# Patient Record
Sex: Female | Born: 2000
Health system: Southern US, Community
[De-identification: ages and names within clinical notes are randomized; demographics above are authoritative.]

## PROBLEM LIST (undated history)

## (undated) DIAGNOSIS — I10 Essential (primary) hypertension: Secondary | ICD-10-CM

## (undated) DIAGNOSIS — N83209 Unspecified ovarian cyst, unspecified side: Secondary | ICD-10-CM

## (undated) DIAGNOSIS — K219 Gastro-esophageal reflux disease without esophagitis: Secondary | ICD-10-CM

## (undated) DIAGNOSIS — R519 Headache, unspecified: Secondary | ICD-10-CM

## (undated) HISTORY — PX: NO PAST SURGERIES: SHX2092

---
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
## 2018-01-01 ENCOUNTER — Other Ambulatory Visit: Payer: Self-pay

## 2018-01-01 ENCOUNTER — Encounter
Admission: RE | Admit: 2018-01-01 | Discharge: 2018-01-01 | Disposition: A | Discharge status: Home / Self Care | Payer: 59 | Source: Ambulatory Visit | Attending: Obstetrics & Gynecology | Admitting: Obstetrics & Gynecology

## 2018-01-01 HISTORY — DX: Gastro-esophageal reflux disease without esophagitis: K21.9

## 2018-01-01 NOTE — Patient Instructions (Signed)
Your procedure is scheduled on: 01-02-18 Report to Same Day Surgery 2nd floor medical mall [facility] Entrance-take elevator on left to 2nd floor.  Check in with surgery information desk.) To find out your arrival time please call [phone] between 1PM - 3PM on 01-01-18  Remember: Instructions that are not followed completely may result in serious medical risk, up to and including death, or upon the discretion of your surgeon and anesthesiologist your surgery may need to be rescheduled.    _x___ 1. Do not eat food after midnight the night before your procedure. You may drink clear liquids up to 2 hours before you are scheduled to arrive at the hospital for your procedure.  Do not drink clear liquids within 2 hours of your scheduled arrival to the hospital.  Clear liquids include  --Water or Apple juice without pulp  --Clear carbohydrate beverage such as ClearFast or Gatorade  --Black Coffee or Clear Tea (No milk, no creamers, do not add anything to the coffee or Tea    ____Ensure clear carbohydrate drink on the way to the hospital for bariatric patients  ____Ensure clear carbohydrate drink 3 hours before surgery for Dr Rutherford Nail patients if physician instructed.   No gum chewing or hard candies.     __x__ 2. No Alcohol for 24 hours before or after surgery.   __x__3. No Smoking or e-cigarettes for 24 prior to surgery.  Do not use any chewable tobacco products for at least 6 hour prior to surgery   ____  4. Bring all medications with you on the day of surgery if instructed.    __x__ 5. Notify your doctor if there is any change in your medical condition     (cold, fever, infections).    x___6. On the morning of surgery brush your teeth with toothpaste and water.  You may rinse your mouth with mouth wash if you wish.  Do not swallow any toothpaste or mouthwash.   Do not wear jewelry, make-up, hairpins, clips or nail polish.  Do not wear lotions, powders, or perfumes. You may  wear deodorant.  Do not shave 48 hours prior to surgery. Men may shave face and neck.  Do not bring valuables to the hospital.    [facility] is not responsible for any belongings or valuables.               Contacts, dentures or bridgework may not be worn into surgery.  Leave your suitcase in the car. After surgery it may be brought to your room.  For patients admitted to the hospital, discharge time is determined by your treatment team.  _  Patients discharged the day of surgery will not be allowed to drive home.  You will need someone to drive you home and stay with you the night of your procedure.    Please read over the following fact sheets that you were given:   [facility] Preparing for Surgery   ____ Take anti-hypertensive listed below, cardiac, seizure, asthma, anti-reflux and psychiatric medicines. These include:  1. NONE  2.  3.  4.  5.  6.  ____Fleets enema or Magnesium Citrate as directed.   ____ Use CHG Soap or sage wipes as directed on instruction sheet   ____ Use inhalers on the day of surgery and bring to hospital day of surgery  ____ Stop Metformin and Janumet 2 days prior to surgery.    ____ Take 1/2 of usual insulin dose the night before surgery and none  on the morning surgery.   ____ Follow recommendations from Cardiologist, Pulmonologist or PCP regarding stopping Aspirin, Coumadin, Plavix ,Eliquis, Effient, or Pradaxa, and Pletal.  X____Stop Anti-inflammatories such as Advil, Aleve, Ibuprofen, Motrin, Naproxen, MELOXICAM, Naprosyn, Goodies powders or aspirin products NOW-OK to take Tylenol    ____ Stop supplements until after surgery.     ____ Bring C-Pap to the hospital.

## 2018-01-02 ENCOUNTER — Ambulatory Visit
Admission: RE | Admit: 2018-01-02 | Discharge: 2018-01-02 | Disposition: A | Discharge status: Home / Self Care | Payer: 59 | Source: Ambulatory Visit | Attending: Obstetrics & Gynecology | Admitting: Obstetrics & Gynecology

## 2018-01-02 ENCOUNTER — Ambulatory Visit: Payer: 59 | Admitting: Certified Registered"

## 2018-01-02 ENCOUNTER — Encounter
Admission: RE | Disposition: A | Discharge status: Home / Self Care | Payer: Self-pay | Source: Ambulatory Visit | Attending: Obstetrics & Gynecology

## 2018-01-02 ENCOUNTER — Other Ambulatory Visit: Payer: Self-pay

## 2018-01-02 DIAGNOSIS — Z30430 Encounter for insertion of intrauterine contraceptive device: Secondary | ICD-10-CM | POA: Insufficient documentation

## 2018-01-02 DIAGNOSIS — N83209 Unspecified ovarian cyst, unspecified side: Secondary | ICD-10-CM | POA: Diagnosis not present

## 2018-01-02 DIAGNOSIS — N920 Excessive and frequent menstruation with regular cycle: Secondary | ICD-10-CM | POA: Insufficient documentation

## 2018-01-02 DIAGNOSIS — R102 Pelvic and perineal pain: Secondary | ICD-10-CM | POA: Diagnosis present

## 2018-01-02 DIAGNOSIS — Z79899 Other long term (current) drug therapy: Secondary | ICD-10-CM | POA: Diagnosis not present

## 2018-01-02 DIAGNOSIS — N9489 Other specified conditions associated with female genital organs and menstrual cycle: Secondary | ICD-10-CM | POA: Diagnosis present

## 2018-01-02 DIAGNOSIS — N809 Endometriosis, unspecified: Secondary | ICD-10-CM | POA: Insufficient documentation

## 2018-01-02 DIAGNOSIS — K219 Gastro-esophageal reflux disease without esophagitis: Secondary | ICD-10-CM | POA: Diagnosis not present

## 2018-01-02 DIAGNOSIS — N840 Polyp of corpus uteri: Secondary | ICD-10-CM | POA: Insufficient documentation

## 2018-01-02 DIAGNOSIS — N858 Other specified noninflammatory disorders of uterus: Secondary | ICD-10-CM | POA: Diagnosis not present

## 2018-01-02 DIAGNOSIS — Z30431 Encounter for routine checking of intrauterine contraceptive device: Secondary | ICD-10-CM | POA: Diagnosis not present

## 2018-01-02 HISTORY — PX: INTRAUTERINE DEVICE (IUD) INSERTION: SHX5877

## 2018-01-02 HISTORY — PX: HYSTEROSCOPY WITH D & C: SHX1775

## 2018-01-02 HISTORY — PX: LAPAROSCOPIC UNILATERAL SALPINGECTOMY: SHX5934

## 2018-01-02 HISTORY — PX: CHROMOPERTUBATION: SHX6288

## 2018-01-02 HISTORY — PX: LAPAROSCOPY: SHX197

## 2018-01-02 LAB — CBC
HCT: 44.6 % (ref 36.0–46.0)
Hemoglobin: 14.1 g/dL (ref 12.0–15.0)
MCH: 27.3 pg (ref 26.0–34.0)
MCHC: 31.6 g/dL (ref 30.0–36.0)
MCV: 86.3 fL (ref 80.0–100.0)
Platelets: 396 10*3/uL (ref 150–400)
RBC: 5.17 MIL/uL — AB (ref 3.87–5.11)
RDW: 13.3 % (ref 11.5–15.5)
WBC: 8.6 10*3/uL (ref 4.0–10.5)
nRBC: 0 % (ref 0.0–0.2)

## 2018-01-02 LAB — TYPE AND SCREEN
ABO/RH(D): A NEG
ANTIBODY SCREEN: NEGATIVE

## 2018-01-02 LAB — BASIC METABOLIC PANEL
Anion gap: 7 (ref 5–15)
BUN: 11 mg/dL (ref 6–20)
CALCIUM: 9.2 mg/dL (ref 8.9–10.3)
CO2: 25 mmol/L (ref 22–32)
CREATININE: 0.64 mg/dL (ref 0.44–1.00)
Chloride: 107 mmol/L (ref 98–111)
GFR calc Af Amer: >60 mL/min (ref >60)
GFR calc non Af Amer: >60 mL/min (ref >60)
GLUCOSE: 95 mg/dL (ref 70–99)
Potassium: 3.8 mmol/L (ref 3.5–5.1)
Sodium: 139 mmol/L (ref 135–145)

## 2018-01-02 LAB — POCT PREGNANCY, URINE: Preg Test, Ur: NEGATIVE

## 2018-01-02 LAB — ABO/RH: ABO/RH(D): A NEG

## 2018-01-02 SURGERY — LAPAROSCOPY, DIAGNOSTIC
Anesthesia: General | Laterality: Right

## 2018-01-02 MED ORDER — FENTANYL CITRATE (PF) 100 MCG/2ML IJ SOLN
INTRAMUSCULAR | Status: DC | PRN
  Administered 2018-01-02: 100 ug via INTRAVENOUS
  Administered 2018-01-02 (×3): 50 ug via INTRAVENOUS

## 2018-01-02 MED ORDER — DEXAMETHASONE SODIUM PHOSPHATE 10 MG/ML IJ SOLN
4.0000 mg | INTRAMUSCULAR | Status: AC
  Administered 2018-01-02: 4 mg via INTRAVENOUS

## 2018-01-02 MED ORDER — FAMOTIDINE 20 MG PO TABS
20.0000 mg | ORAL_TABLET | Freq: Once | ORAL | Status: AC
  Administered 2018-01-02: 20 mg via ORAL

## 2018-01-02 MED ORDER — IBUPROFEN 800 MG PO TABS: 800.0000 mg | ORAL_TABLET | Freq: Four times a day (QID) | ORAL | 0 refills | Status: DC

## 2018-01-02 MED ORDER — ONDANSETRON HCL 4 MG/2ML IJ SOLN
INTRAMUSCULAR | Status: AC
  Filled 2018-01-02: qty 2

## 2018-01-02 MED ORDER — LIDOCAINE HCL (PF) 2 % IJ SOLN
INTRAMUSCULAR | Status: AC
  Filled 2018-01-02: qty 10

## 2018-01-02 MED ORDER — ACETAMINOPHEN 650 MG RE SUPP
650.0000 mg | RECTAL | Status: DC | PRN
  Filled 2018-01-02: qty 1

## 2018-01-02 MED ORDER — GABAPENTIN 300 MG PO CAPS
ORAL_CAPSULE | ORAL | Status: AC
  Administered 2018-01-02: 600 mg via ORAL
  Filled 2018-01-02: qty 2

## 2018-01-02 MED ORDER — FENTANYL CITRATE (PF) 100 MCG/2ML IJ SOLN
INTRAMUSCULAR | Status: AC
  Administered 2018-01-02: 25 ug via INTRAVENOUS
  Filled 2018-01-02: qty 2

## 2018-01-02 MED ORDER — PROMETHAZINE HCL 25 MG/ML IJ SOLN: 6.2500 mg | INTRAMUSCULAR | Status: DC | PRN

## 2018-01-02 MED ORDER — MIDAZOLAM HCL 2 MG/2ML IJ SOLN
INTRAMUSCULAR | Status: DC | PRN
  Administered 2018-01-02: 2 mg via INTRAVENOUS

## 2018-01-02 MED ORDER — METHYLENE BLUE 0.5 % INJ SOLN
INTRAVENOUS | Status: AC
  Filled 2018-01-02: qty 10

## 2018-01-02 MED ORDER — CELECOXIB 200 MG PO CAPS
ORAL_CAPSULE | ORAL | Status: AC
  Administered 2018-01-02: 400 mg via ORAL
  Filled 2018-01-02: qty 2

## 2018-01-02 MED ORDER — MIDAZOLAM HCL 2 MG/2ML IJ SOLN
INTRAMUSCULAR | Status: AC
  Filled 2018-01-02: qty 2

## 2018-01-02 MED ORDER — DEXAMETHASONE SODIUM PHOSPHATE 10 MG/ML IJ SOLN
INTRAMUSCULAR | Status: AC
  Administered 2018-01-02: 4 mg via INTRAVENOUS
  Filled 2018-01-02: qty 1

## 2018-01-02 MED ORDER — SUGAMMADEX SODIUM 200 MG/2ML IV SOLN
INTRAVENOUS | Status: DC | PRN
  Administered 2018-01-02: 200 mg via INTRAVENOUS

## 2018-01-02 MED ORDER — FENTANYL CITRATE (PF) 100 MCG/2ML IJ SOLN
INTRAMUSCULAR | Status: AC
  Filled 2018-01-02: qty 2

## 2018-01-02 MED ORDER — PROPOFOL 10 MG/ML IV BOLUS
INTRAVENOUS | Status: DC | PRN
  Administered 2018-01-02: 200 mg via INTRAVENOUS

## 2018-01-02 MED ORDER — DEXAMETHASONE SODIUM PHOSPHATE 10 MG/ML IJ SOLN
INTRAMUSCULAR | Status: AC
  Filled 2018-01-02: qty 1

## 2018-01-02 MED ORDER — LACTATED RINGERS IV SOLN
INTRAVENOUS | Status: DC
  Administered 2018-01-02: 10:00:00 via INTRAVENOUS

## 2018-01-02 MED ORDER — FENTANYL CITRATE (PF) 100 MCG/2ML IJ SOLN
25.0000 ug | INTRAMUSCULAR | Status: AC | PRN
  Administered 2018-01-02 (×6): 25 ug via INTRAVENOUS

## 2018-01-02 MED ORDER — CELECOXIB 200 MG PO CAPS
400.0000 mg | ORAL_CAPSULE | ORAL | Status: AC
  Administered 2018-01-02: 400 mg via ORAL

## 2018-01-02 MED ORDER — KETOROLAC TROMETHAMINE 30 MG/ML IJ SOLN
INTRAMUSCULAR | Status: DC | PRN
  Administered 2018-01-02: 30 mg via INTRAVENOUS

## 2018-01-02 MED ORDER — ONDANSETRON HCL 4 MG/2ML IJ SOLN
INTRAMUSCULAR | Status: DC | PRN
  Administered 2018-01-02: 4 mg via INTRAVENOUS

## 2018-01-02 MED ORDER — SUGAMMADEX SODIUM 200 MG/2ML IV SOLN
INTRAVENOUS | Status: AC
  Filled 2018-01-02: qty 2

## 2018-01-02 MED ORDER — MORPHINE SULFATE (PF) 4 MG/ML IV SOLN: 1.0000 mg | INTRAVENOUS | Status: DC | PRN

## 2018-01-02 MED ORDER — ACETAMINOPHEN 500 MG PO TABS
ORAL_TABLET | ORAL | Status: AC
  Administered 2018-01-02: 1000 mg via ORAL
  Filled 2018-01-02: qty 2

## 2018-01-02 MED ORDER — GABAPENTIN 300 MG PO CAPS
600.0000 mg | ORAL_CAPSULE | ORAL | Status: AC
  Administered 2018-01-02: 600 mg via ORAL

## 2018-01-02 MED ORDER — ROCURONIUM BROMIDE 50 MG/5ML IV SOLN
INTRAVENOUS | Status: AC
  Filled 2018-01-02: qty 1

## 2018-01-02 MED ORDER — OXYCODONE HCL 5 MG PO TABS: 5.0000 mg | ORAL_TABLET | Freq: Three times a day (TID) | ORAL | 0 refills | Status: AC | PRN

## 2018-01-02 MED ORDER — PROPOFOL 10 MG/ML IV BOLUS
INTRAVENOUS | Status: AC
  Filled 2018-01-02: qty 20

## 2018-01-02 MED ORDER — ACETAMINOPHEN 325 MG PO TABS: 650.0000 mg | ORAL_TABLET | ORAL | Status: DC | PRN

## 2018-01-02 MED ORDER — KETOROLAC TROMETHAMINE 30 MG/ML IJ SOLN: 30.0000 mg | Freq: Four times a day (QID) | INTRAMUSCULAR | Status: DC

## 2018-01-02 MED ORDER — DEXAMETHASONE SODIUM PHOSPHATE 10 MG/ML IJ SOLN
INTRAMUSCULAR | Status: DC | PRN
  Administered 2018-01-02: 8 mg via INTRAVENOUS

## 2018-01-02 MED ORDER — ROCURONIUM BROMIDE 100 MG/10ML IV SOLN
INTRAVENOUS | Status: DC | PRN
  Administered 2018-01-02 (×2): 10 mg via INTRAVENOUS
  Administered 2018-01-02: 40 mg via INTRAVENOUS

## 2018-01-02 MED ORDER — LIDOCAINE HCL (CARDIAC) PF 100 MG/5ML IV SOSY
PREFILLED_SYRINGE | INTRAVENOUS | Status: DC | PRN
  Administered 2018-01-02: 100 mg via INTRAVENOUS

## 2018-01-02 MED ORDER — BUPIVACAINE HCL (PF) 0.5 % IJ SOLN
INTRAMUSCULAR | Status: AC
  Filled 2018-01-02: qty 30

## 2018-01-02 MED ORDER — FAMOTIDINE 20 MG PO TABS
ORAL_TABLET | ORAL | Status: AC
  Administered 2018-01-02: 20 mg via ORAL
  Filled 2018-01-02: qty 1

## 2018-01-02 MED ORDER — ACETAMINOPHEN 500 MG PO TABS
1000.0000 mg | ORAL_TABLET | ORAL | Status: AC
  Administered 2018-01-02: 1000 mg via ORAL

## 2018-01-02 MED ORDER — SCOPOLAMINE 1 MG/3DAYS TD PT72
MEDICATED_PATCH | TRANSDERMAL | Status: AC
  Filled 2018-01-02: qty 1

## 2018-01-02 MED ORDER — SCOPOLAMINE 1 MG/3DAYS TD PT72
1.0000 | MEDICATED_PATCH | TRANSDERMAL | Status: DC
  Administered 2018-01-02: 1.5 mg via TRANSDERMAL

## 2018-01-02 MED ORDER — LACTATED RINGERS IV SOLN: INTRAVENOUS | Status: DC

## 2018-01-02 SURGICAL SUPPLY — 58 items
BAG INFUSER PRESSURE 100CC (MISCELLANEOUS) ×3 IMPLANT
BAG URINE DRAINAGE (UROLOGICAL SUPPLIES) ×3 IMPLANT
BLADE SURG SZ11 CARB STEEL (BLADE) ×3 IMPLANT
CANISTER SUCT 1200ML W/VALVE (MISCELLANEOUS) ×3 IMPLANT
CATH FOLEY 2WAY  5CC 16FR (CATHETERS) ×1
CATH ROBINSON RED A/P 16FR (CATHETERS) ×3 IMPLANT
CATH URTH 16FR FL 2W BLN LF (CATHETERS) ×2 IMPLANT
CHLORAPREP W/TINT 26ML (MISCELLANEOUS) ×3 IMPLANT
COVER WAND RF STERILE (DRAPES) ×3 IMPLANT
DEFOGGER SCOPE WARMER CLEARIFY (MISCELLANEOUS) ×3 IMPLANT
DERMABOND ADVANCED (GAUZE/BANDAGES/DRESSINGS) ×1
DERMABOND ADVANCED .7 DNX12 (GAUZE/BANDAGES/DRESSINGS) ×2 IMPLANT
DRAPE LEGGINS SURG 28X43 STRL (DRAPES) ×3 IMPLANT
DRAPE UNDER BUTTOCK W/FLU (DRAPES) ×3 IMPLANT
ELECT REM PT RETURN 9FT ADLT (ELECTROSURGICAL) ×3
ELECTRODE REM PT RTRN 9FT ADLT (ELECTROSURGICAL) ×2 IMPLANT
GLOVE PI ORTHOPRO 6.5 (GLOVE) ×1
GLOVE PI ORTHOPRO STRL 6.5 (GLOVE) ×2 IMPLANT
GLOVE SURG SYN 6.5 ES PF (GLOVE) ×6 IMPLANT
GOWN STRL REUS W/ TWL LRG LVL3 (GOWN DISPOSABLE) ×4 IMPLANT
GOWN STRL REUS W/TWL LRG LVL3 (GOWN DISPOSABLE) ×2
GRASPER SUT TROCAR 14GX15 (MISCELLANEOUS) ×3 IMPLANT
HANDLE YANKAUER SUCT BULB TIP (MISCELLANEOUS) ×3 IMPLANT
IRRIGATION STRYKERFLOW (MISCELLANEOUS) IMPLANT
IRRIGATOR STRYKERFLOW (MISCELLANEOUS)
IV NS 1000ML (IV SOLUTION)
IV NS 1000ML BAXH (IV SOLUTION) IMPLANT
KIT PINK PAD W/HEAD ARE REST (MISCELLANEOUS) ×3
KIT PINK PAD W/HEAD ARM REST (MISCELLANEOUS) ×2 IMPLANT
KIT PROCEDURE FLUENT (KITS) IMPLANT
KIT TURNOVER CYSTO (KITS) ×3 IMPLANT
L-HOOK LAP DISP 36CM (ELECTROSURGICAL) ×3
LABEL OR SOLS (LABEL) IMPLANT
LHOOK LAP DISP 36CM (ELECTROSURGICAL) ×2 IMPLANT
LIGASURE VESSEL 5MM BLUNT TIP (ELECTROSURGICAL) ×3 IMPLANT
Liletta ×3 IMPLANT
MANIPULATOR UTERINE 4.5 ZUMI (MISCELLANEOUS) ×3 IMPLANT
NEEDLE HYPO 22GX1.5 SAFETY (NEEDLE) ×3 IMPLANT
NEEDLE SPNL 22GX3.5 QUINCKE BK (NEEDLE) ×3 IMPLANT
NS IRRIG 500ML POUR BTL (IV SOLUTION) ×3 IMPLANT
PACK DNC HYST (MISCELLANEOUS) ×3 IMPLANT
PACK LAP CHOLECYSTECTOMY (MISCELLANEOUS) ×3 IMPLANT
PAD OB MATERNITY 4.3X12.25 (PERSONAL CARE ITEMS) ×3 IMPLANT
PAD PREP 24X41 OB/GYN DISP (PERSONAL CARE ITEMS) ×3 IMPLANT
PENCIL ELECTRO HAND CTR (MISCELLANEOUS) ×3 IMPLANT
POUCH SPECIMEN RETRIEVAL 10MM (ENDOMECHANICALS) IMPLANT
SCISSORS METZENBAUM CVD 33 (INSTRUMENTS) ×3 IMPLANT
SLEEVE ENDOPATH XCEL 5M (ENDOMECHANICALS) ×6 IMPLANT
SOL .9 NS 3000ML IRR  AL (IV SOLUTION)
SOL .9 NS 3000ML IRR UROMATIC (IV SOLUTION) IMPLANT
SUT MNCRL AB 4-0 PS2 18 (SUTURE) ×3 IMPLANT
SUT VIC AB 2-0 UR6 27 (SUTURE) ×3 IMPLANT
SYR 10ML LL (SYRINGE) ×3 IMPLANT
TOWEL OR 17X26 4PK STRL BLUE (TOWEL DISPOSABLE) ×3 IMPLANT
TROCAR ENDO BLADELESS 11MM (ENDOMECHANICALS) IMPLANT
TROCAR XCEL NON-BLD 5MMX100MML (ENDOMECHANICALS) ×3 IMPLANT
TUBING CONNECTING 10 (TUBING) ×3 IMPLANT
TUBING INSUFFLATION (TUBING) ×3 IMPLANT

## 2018-01-02 NOTE — Discharge Instructions (Signed)
AMBULATORY SURGERY  °DISCHARGE INSTRUCTIONS ° ° °1) The drugs that you were given will stay in your system until tomorrow so for the next 24 hours you should not: ° °A) Drive an automobile °B) Make any legal decisions °C) Drink any alcoholic beverage ° ° °2) You may resume regular meals tomorrow.  Today it is better to start with liquids and gradually work up to solid foods. ° °You may eat anything you prefer, but it is better to start with liquids, then soup and crackers, and gradually work up to solid foods. ° ° °3) Please notify your doctor immediately if you have any unusual bleeding, trouble breathing, redness and pain at the surgery site, drainage, fever, or pain not relieved by medication. ° ° ° °4) Additional Instructions: ° ° ° ° ° ° ° °Please contact your physician with any problems or Same Day Surgery at [phone], Monday through Friday 6 am to 4 pm, or [facility] [place] number at [phone].Diagnostic Laparoscopy, Care After °Refer to this sheet in the next few weeks. These instructions provide you with information about caring for yourself after your procedure. Your health care provider may also give you more specific instructions. Your treatment has been planned according to current medical practices, but problems sometimes occur. Call your health care provider if you have any problems or questions after your procedure. °What can I expect after the procedure? °After your procedure, it is common to have mild discomfort in the throat and abdomen. °Follow these instructions at home: °· Take over-the-counter and prescription medicines only as told by your health care provider. °· Do not drive for 24 hours if you received a sedative. °· Return to your normal activities as told by your health care provider. °· Do not take baths, swim, or use a hot tub until your health care provider approves. You may shower. °· Follow instructions from your health care provider about how to take care of your  incision. Make sure you: °? Wash your hands with soap and water before you change your bandage (dressing). If soap and water are not available, use hand sanitizer. °? Change your dressing as told by your health care provider. °? Leave stitches (sutures), skin glue, or adhesive strips in place. These skin closures may need to stay in place for 2 weeks or longer. If adhesive strip edges start to loosen and curl up, you may trim the loose edges. Do not remove adhesive strips completely unless your health care provider tells you to do that. °· Check your incision area every day for signs of infection. Check for: °? More redness, swelling, or pain. °? More fluid or blood. °? Warmth. °? Pus or a bad smell. °· It is your responsibility to get the results of your procedure. Ask your health care provider or the department performing the procedure when your results will be ready. °Contact a health care provider if: °· There is new pain in your shoulders. °· You feel light-headed or faint. °· You are unable to pass gas or unable to have a bowel movement. °· You feel nauseous or you vomit. °· You develop a rash. °· You have more redness, swelling, or pain around your incision. °· You have more fluid or blood coming from your incision. °· Your incision feels warm to the touch. °· You have pus or a bad smell coming from your incision. °· You have a fever or chills. °Get help right away if: °· Your pain is getting worse. °·   You have ongoing vomiting. °· The edges of your incision open up. °· You have trouble breathing. °· You have chest pain. °This information is not intended to replace advice given to you by your health care provider. Make sure you discuss any questions you have with your health care provider. °Document Released: 02/01/2015 Document Revised: 07/29/2015 Document Reviewed: 11/03/2014 °[employer]vier Interactive Patient Education © 2018 [employer] ° °

## 2018-01-02 NOTE — Anesthesia Preprocedure Evaluation (Addendum)
Anesthesia Evaluation  Patient identified by MRN, date of birth, ID band Patient awake    Reviewed: Allergy & Precautions, H&P , NPO status , Patient's Chart, lab work & pertinent test results  Airway Mallampati: II  TM Distance: >3 FB Neck ROM: full    Dental  (+) Teeth Intact   Pulmonary neg pulmonary ROS,           Cardiovascular negative cardio ROS       Neuro/Psych negative neurological ROS  negative psych ROS   GI/Hepatic negative GI ROS, Neg liver ROS, GERD  ,  Endo/Other  negative endocrine ROSMorbid obesity  Renal/GU      Musculoskeletal   Abdominal   Peds  Hematology negative hematology ROS (+)   Anesthesia Other Findings Past Medical History: No date: GERD (gastroesophageal reflux disease)     Comment:  H/O No date: Ovarian cyst  Past Surgical History: No date: NO PAST SURGERIES     Reproductive/Obstetrics negative OB ROS                            Anesthesia Physical Anesthesia Plan  ASA: II  Anesthesia Plan: General LMA   Post-op Pain Management:    Induction:   PONV Risk Score and Plan: Ondansetron, Dexamethasone and Scopolamine patch - Pre-op  Airway Management Planned:   Additional Equipment:   Intra-op Plan:   Post-operative Plan:   Informed Consent: I have reviewed the patients History and Physical, chart, labs and discussed the procedure including the risks, benefits and alternatives for the proposed anesthesia with the patient or authorized representative who has indicated his/her understanding and acceptance.   Dental Advisory Given  Plan Discussed with: Anesthesiologist, CRNA and Surgeon  Anesthesia Plan Comments:        Anesthesia Quick Evaluation

## 2018-01-02 NOTE — Transfer of Care (Signed)
Immediate Anesthesia Transfer of Care Note  Patient: Paige Serrano  Procedure(s) Performed: LAPAROSCOPY DIAGNOSTIC (N/A ) DILATATION AND CURETTAGE /HYSTEROSCOPY (N/A ) INTRAUTERINE DEVICE (IUD) INSERTION (N/A )  Patient Location: PACU  Anesthesia Type:General  Level of Consciousness: drowsy  Airway & Oxygen Therapy: Patient Spontanous Breathing and Patient connected to face mask oxygen  Post-op Assessment: Report given to RN and Post -op Vital signs reviewed and stable  Post vital signs: Reviewed and stable  Last Vitals:  Vitals Value Taken Time  BP 106/72 01/02/2018  1:20 PM  Temp 36.8 C 01/02/2018  1:18 PM  Pulse 67 01/02/2018  1:20 PM  Resp 17 01/02/2018  1:20 PM  SpO2 98 % 01/02/2018  1:20 PM  Vitals shown include unvalidated device data.  Last Pain:  Vitals:   01/02/18 1009  TempSrc: Temporal  PainSc: 5          Complications: No apparent anesthesia complications

## 2018-01-02 NOTE — Anesthesia Post-op Follow-up Note (Signed)
Anesthesia QCDR form completed.        

## 2018-01-02 NOTE — OR Nursing (Signed)
Discussed discharge instructions with pt and Mom. Both voice understanding. 

## 2018-01-02 NOTE — H&P (Signed)
Preoperative History and Physical  Paige Serrano is a 17 y.o. G0. here for surgical management of pelvic pain, endometrial mass, and pregnancy prevention.   No significant preoperative concerns.  Proposed surgery: Dx lap, D&c hysteroscopy, IUD insertion and other procedures as indicated.  Past Medical History:  Diagnosis Date  . GERD (gastroesophageal reflux disease)    H/O  . Ovarian cyst    Past Surgical History:  Procedure Laterality Date  . NO PAST SURGERIES     OB History  No data available  Patient denies any other pertinent gynecologic issues.   No current facility-administered medications on file prior to encounter.    Current Outpatient Medications on File Prior to Encounter  Medication Sig Dispense Refill  . famotidine (PEPCID) 40 MG tablet Take 1 tablet (40 mg total) by mouth daily. (Patient not taking: Reported on 01/01/2018) 5 tablet 0  . HYDROcodone-acetaminophen (NORCO) 5-325 MG tablet Take 1 tablet by mouth every 6 (six) hours as needed for up to 7 doses for severe pain. (Patient not taking: Reported on 01/01/2018) 7 tablet 0  . hydrOXYzine (ATARAX/VISTARIL) 25 MG tablet Take 1 tablet (25 mg total) by mouth 3 (three) times daily as needed for itching. (Patient not taking: Reported on 01/01/2018) 15 tablet 0  . ibuprofen (ADVIL,MOTRIN) 600 MG tablet Take 1 tablet (600 mg total) by mouth every 8 (eight) hours as needed. (Patient not taking: Reported on 01/01/2018) 30 tablet 0  . oxyCODONE-acetaminophen (PERCOCET) 5-325 MG tablet Take 1 tablet by mouth every 4 (four) hours as needed for severe pain. (Patient not taking: Reported on 01/01/2018) 6 tablet 0  . predniSONE (DELTASONE) 10 MG tablet Take 1 tablet (10 mg total) by mouth daily. (Patient not taking: Reported on 01/01/2018) 42 tablet 0   No Known Allergies  Social History:   reports that she has never smoked. She has never used smokeless tobacco. She reports that she does not drink alcohol or use  drugs.  History reviewed. No pertinent family history.  Review of Systems: Noncontributory  PHYSICAL EXAM: Blood pressure (!) 114/102, pulse 87, temperature (!) 97.3 F (36.3 C), temperature source Temporal, resp. rate 16, last menstrual period 12/28/2017, SpO2 100 %. General appearance - alert, well appearing, and in no distress Chest - clear to auscultation, no wheezes, rales or rhonchi, symmetric air entry Heart - normal rate and regular rhythm Abdomen - soft, nontender, nondistended, no masses or organomegaly Pelvic - examination not indicated Extremities - peripheral pulses normal, no pedal edema, no clubbing or cyanosis  Labs: Results for orders placed or performed during the hospital encounter of 01/02/18 (from the past 336 hour(s))  CBC   Collection Time: 01/02/18 10:07 AM  Result Value Ref Range   WBC 8.6 4.0 - 10.5 K/uL   RBC 5.17 (H) 3.87 - 5.11 MIL/uL   Hemoglobin 14.1 12.0 - 15.0 g/dL   HCT 29.5 62.1 - 30.8 %   MCV 86.3 80.0 - 100.0 fL   MCH 27.3 26.0 - 34.0 pg   MCHC 31.6 30.0 - 36.0 g/dL   RDW 65.7 84.6 - 96.2 %   Platelets 396 150 - 400 K/uL   nRBC 0.0 0.0 - 0.2 %  Basic metabolic panel   Collection Time: 01/02/18 10:07 AM  Result Value Ref Range   Sodium 139 135 - 145 mmol/L   Potassium 3.8 3.5 - 5.1 mmol/L   Chloride 107 98 - 111 mmol/L   CO2 25 22 - 32 mmol/L   Glucose, Bld 95  70 - 99 mg/dL   BUN 11 6 - 20 mg/dL   Creatinine, Ser 1.61 0.44 - 1.00 mg/dL   Calcium 9.2 8.9 - 09.6 mg/dL   GFR calc non Af Amer >60 >60 mL/min   GFR calc Af Amer >60 >60 mL/min   Anion gap 7 5 - 15  Pregnancy, urine POC   Collection Time: 01/02/18 10:07 AM  Result Value Ref Range   Preg Test, Ur NEGATIVE NEGATIVE  Type and screen [facility]   Collection Time: 01/02/18 10:07 AM  Result Value Ref Range   ABO/RH(D) PENDING    Antibody Screen PENDING    Sample Expiration      01/05/2018 Performed at [facility], [address]  [place], [place] [address]   ABO/Rh   Collection Time: 01/02/18 10:16 AM  Result Value Ref Range   ABO/RH(D)      A NEG Performed at [facility], [address] [place], [place] [address]     Assessment: Patient Active Problem List   Diagnosis Date Noted  . Endometrial mass 01/02/2018  . Pelvic pain 01/02/2018    Plan: Patient will undergo surgical management with diagnostic laparoscopy, D&C hysteroscopy, and IUD insertion.   The risks of surgery were discussed in detail with the patient including but not limited to: bleeding which may require transfusion or reoperation; infection which may require antibiotics; injury to surrounding organs which may involve bowel, bladder, ureters ; need for additional procedures including laparoscopy or laparotomy; thromboembolic phenomenon, surgical site problems and other postoperative/anesthesia complications. Likelihood of success in alleviating the patient's condition was discussed. Routine postoperative instructions will be reviewed with the patient and her family in detail after surgery.  The patient concurred with the proposed plan, giving informed written consent for the surgery.  Patient has been NPO since last night she will remain NPO for procedure.  Anesthesia and OR aware.  Preoperative prophylactic antibiotics and SCDs ordered on call to the OR.  To OR when ready.  ----- Ranae Plumber, MD Attending Obstetrician and Gynecologist [facility], Department of OB/GYN [facility]

## 2018-01-02 NOTE — Anesthesia Procedure Notes (Signed)
Procedure Name: Intubation Performed by: Shruti Arrey, CRNA Pre-anesthesia Checklist: Patient identified, Patient being monitored, Timeout performed, Emergency Drugs available and Suction available Patient Re-evaluated:Patient Re-evaluated prior to induction Oxygen Delivery Method: Circle system utilized Preoxygenation: Pre-oxygenation with 100% oxygen Induction Type: IV induction Ventilation: Mask ventilation without difficulty Laryngoscope Size: Mac and 3 Grade View: Grade I Tube type: Oral Tube size: 7.0 mm Number of attempts: 1 Airway Equipment and Method: Stylet Placement Confirmation: ETT inserted through vocal cords under direct vision,  positive ETCO2 and breath sounds checked- equal and bilateral Secured at: 21 cm Tube secured with: Tape Dental Injury: Teeth and Oropharynx as per pre-operative assessment        

## 2018-01-03 ENCOUNTER — Other Ambulatory Visit: Payer: 59

## 2018-01-03 NOTE — Anesthesia Postprocedure Evaluation (Signed)
Anesthesia Post Note  Patient: Paige Serrano  Procedure(s) Performed: LAPAROSCOPY DIAGNOSTIC (N/A ) DILATATION AND CURETTAGE /HYSTEROSCOPY (N/A ) INTRAUTERINE DEVICE (IUD) INSERTION (N/A ) CHROMOPERTUBATION (N/A ) LAPAROSCOPIC UNILATERAL SALPINGECTOMY (Right )  Patient location during evaluation: PACU Anesthesia Type: General Level of consciousness: awake and alert Pain management: pain level controlled Vital Signs Assessment: post-procedure vital signs reviewed and stable Respiratory status: spontaneous breathing, nonlabored ventilation and respiratory function stable Cardiovascular status: blood pressure returned to baseline and stable Postop Assessment: no apparent nausea or vomiting Anesthetic complications: no     Last Vitals:  Vitals:   01/02/18 1416 01/02/18 1429  BP: 117/77 122/84  Pulse: 74 69  Resp: 18 18  Temp: (!) 36.3 C (!) 36.2 C  SpO2: 99% 100%    Last Pain:  Vitals:   01/02/18 1429  TempSrc:   PainSc: 3                  Jovita Gamma

## 2018-01-08 LAB — SURGICAL PATHOLOGY

## 2018-01-09 DIAGNOSIS — B084 Enteroviral vesicular stomatitis with exanthem: Secondary | ICD-10-CM | POA: Diagnosis not present

## 2018-01-09 DIAGNOSIS — L309 Dermatitis, unspecified: Secondary | ICD-10-CM | POA: Diagnosis not present

## 2018-01-11 NOTE — Op Note (Addendum)
Paige Crane Darrington PROCEDURE DATE: 01/02/2018  PATIENT:  Paige Serrano  17 y.o. female  PRE-OPERATIVE DIAGNOSIS:  pelvic pain, endometrial mass, menorrhagia, pregnancy prevention  POST-OPERATIVE DIAGNOSIS:  pelvic pain, endometrial mass, menorrhagia, obstructed right fallopian tube, pregnancy prevention  PROCEDURE:  Procedure(s): LAPAROSCOPY DIAGNOSTIC (N/A) DILATATION AND CURETTAGE /HYSTEROSCOPY (N/A) INTRAUTERINE DEVICE (IUD) INSERTION (N/A) CHROMOPERTUBATION (N/A) LAPAROSCOPIC UNILATERAL SALPINGECTOMY (Right)  SURGEON:  Surgeon(s) and Role:    * , Elenora Fender, MD - Primary  ANESTHESIA:  General via ET  I/O  See flow sheet; minimal blood loss  FINDINGS:  Small uterus, normal ovaries and normal-appearing fallopian tubes bilaterally.  Normal upper abdomen.  Chromopertubation showed patent LEFT tube but obstructed RIGHT tube.  Allen-masters window on right uterosacral ligament.  Stage 1 endometriosis with one hemosiderin deposit. Hysteroscope: endometrial polyp  SPECIMEN: left fallopian tube, endometrial polyp  COMPLICATIONS: none apparent  DISPOSITION: vital signs stable to PACU   Indication for Surgery: 17 y.o. with recent chlamydia exposure and delayed treatment, with left sided pelvic pain, heavy bleeding and endometrial mass at fundus, and desired pregnancy prevention  Risks of surgery were discussed with the patient including but not limited to: bleeding which may require transfusion or reoperation; infection which may require antibiotics; injury to bowel, bladder, ureters or other surrounding organs; need for additional procedures including laparotomy, blood clot, incisional problems and other postoperative/anesthesia complications. Written informed consent was obtained.      PROCEDURE IN DETAIL:  The patient had sequential compression devices applied to her lower extremities while in the preoperative area.  She was then taken to the operating room where general  anesthesia was administered and was found to be adequate.  She was placed in the dorsal lithotomy position, and was prepped and draped in a sterile manner.  A Foley catheter was inserted into her bladder and attached to constant drainage and a uterine manipulator was then advanced into the uterus .  After a surgical timeout was performed, attention was turned to the abdomen where an umbilical incision was made with the scalpel. A 5mm trochar was inserted in the umbilical incision using a visiport method. Opening pressure was , and the abdomen was insufflated to carbon dioxide gas and adequate pneumoperitoneum was obtained. A survey of the patient's pelvis and abdomen revealed the findings as mentioned above.One 5mm ports was inserted in the suprapubic area under visualization.    Methylene blue-dyed saline was injected through the uterine manipulator, and there was no spillage from the RIGHT tube, after several attempts.  There was no collection near the fimbriated end, indicating a proximal occlusion.  The decision was made to remove the right tube.  The Ligasure was used to ligate the mesosalpinx along the ovary and utero-ovarian ligament and to the cornua.  The tube was grasped and removed through the port.    The operative site was surveyed, and it was found to be hemostatic.  No intraoperative injury to surrounding organs was noted.  The abdomen was desufflated and all instruments were then removed from the patient's abdomen.   All skin incisions were closed with 4-0 monocryl and covered with surgical glue.  The uterine manipulator was removed without complications. A speculum was then placed in the patient's vagina and a single tooth tenaculum was applied to the anterior lip of the cervix. The uterus was sounded to 7.5cm. Her cervix was serially dilated to accommodate the hysteroscope, with findings as above. A sharp curettage was then performed until there was a gritty  texture in all four  quadrants. The specimen was handed off to nursing.  The camera was reinserted and confirmed the uterus had been evacuated. The Liletta IUD device was deployed in the typical fashion without difficulty. The tenaculum was removed from the anterior lip of the cervix and the vaginal speculum was removed after noting good hemostasis.   The patient tolerated the procedures well.  All instruments, needles, and sponge counts were correct x 2. The patient was taken to the recovery room in stable condition.   ---- Ranae Plumber, MD Attending Obstetrician and Gynecologist [facility]

## 2018-01-21 DIAGNOSIS — Z23 Encounter for immunization: Secondary | ICD-10-CM | POA: Diagnosis not present

## 2018-11-22 ENCOUNTER — Other Ambulatory Visit: Payer: Self-pay

## 2018-11-22 ENCOUNTER — Ambulatory Visit
Admission: RE | Admit: 2018-11-22 | Discharge: 2018-11-22 | Disposition: A | Discharge status: Home / Self Care | Payer: Managed Care, Other (non HMO) | Source: Ambulatory Visit | Attending: Family Medicine | Admitting: Family Medicine

## 2018-11-22 ENCOUNTER — Other Ambulatory Visit: Payer: Self-pay | Admitting: Family Medicine

## 2018-11-22 DIAGNOSIS — R519 Headache, unspecified: Secondary | ICD-10-CM

## 2018-11-22 DIAGNOSIS — R51 Headache: Secondary | ICD-10-CM | POA: Insufficient documentation

## 2018-12-23 ENCOUNTER — Other Ambulatory Visit: Payer: Self-pay

## 2018-12-23 DIAGNOSIS — Z20822 Contact with and (suspected) exposure to covid-19: Secondary | ICD-10-CM

## 2018-12-25 LAB — NOVEL CORONAVIRUS, NAA: SARS-CoV-2, NAA: NOT DETECTED

## 2020-02-28 ENCOUNTER — Emergency Department: Payer: Managed Care, Other (non HMO)

## 2020-02-28 ENCOUNTER — Emergency Department
Admission: EM | Admit: 2020-02-28 | Discharge: 2020-02-28 | Disposition: A | Discharge status: Home / Self Care | Payer: Managed Care, Other (non HMO) | Attending: Emergency Medicine | Admitting: Emergency Medicine

## 2020-02-28 ENCOUNTER — Encounter: Payer: Self-pay | Admitting: Emergency Medicine

## 2020-02-28 DIAGNOSIS — Z20822 Contact with and (suspected) exposure to covid-19: Secondary | ICD-10-CM | POA: Insufficient documentation

## 2020-02-28 DIAGNOSIS — I10 Essential (primary) hypertension: Secondary | ICD-10-CM | POA: Insufficient documentation

## 2020-02-28 DIAGNOSIS — Z79899 Other long term (current) drug therapy: Secondary | ICD-10-CM | POA: Diagnosis not present

## 2020-02-28 DIAGNOSIS — R1110 Vomiting, unspecified: Secondary | ICD-10-CM | POA: Diagnosis not present

## 2020-02-28 DIAGNOSIS — I159 Secondary hypertension, unspecified: Secondary | ICD-10-CM

## 2020-02-28 DIAGNOSIS — E860 Dehydration: Secondary | ICD-10-CM | POA: Diagnosis not present

## 2020-02-28 DIAGNOSIS — R0789 Other chest pain: Secondary | ICD-10-CM

## 2020-02-28 DIAGNOSIS — R079 Chest pain, unspecified: Secondary | ICD-10-CM

## 2020-02-28 LAB — URINALYSIS, COMPLETE (UACMP) WITH MICROSCOPIC
Bilirubin Urine: NEGATIVE
Glucose, UA: NEGATIVE mg/dL
Hgb urine dipstick: NEGATIVE
Ketones, ur: 20 mg/dL — AB
Leukocytes,Ua: NEGATIVE
Nitrite: NEGATIVE
Protein, ur: 30 mg/dL — AB
Specific Gravity, Urine: 1.029 (ref 1.005–1.030)
pH: 5 (ref 5.0–8.0)

## 2020-02-28 LAB — COMPREHENSIVE METABOLIC PANEL
ALT: 28 U/L (ref 0–44)
AST: 20 U/L (ref 15–41)
Albumin: 4.1 g/dL (ref 3.5–5.0)
Alkaline Phosphatase: 68 U/L (ref 38–126)
Anion gap: 12 (ref 5–15)
BUN: 12 mg/dL (ref 6–20)
CO2: 20 mmol/L — ABNORMAL LOW (ref 22–32)
Calcium: 9 mg/dL (ref 8.9–10.3)
Chloride: 102 mmol/L (ref 98–111)
Creatinine, Ser: 0.76 mg/dL (ref 0.44–1.00)
GFR, Estimated: >60 mL/min (ref >60)
Glucose, Bld: 112 mg/dL — ABNORMAL HIGH (ref 70–99)
Potassium: 3.4 mmol/L — ABNORMAL LOW (ref 3.5–5.1)
Sodium: 134 mmol/L — ABNORMAL LOW (ref 135–145)
Total Bilirubin: 2.7 mg/dL — ABNORMAL HIGH (ref 0.3–1.2)
Total Protein: 7.3 g/dL (ref 6.5–8.1)

## 2020-02-28 LAB — CBC
HCT: 46.3 % — ABNORMAL HIGH (ref 36.0–46.0)
Hemoglobin: 15.1 g/dL — ABNORMAL HIGH (ref 12.0–15.0)
MCH: 28 pg (ref 26.0–34.0)
MCHC: 32.6 g/dL (ref 30.0–36.0)
MCV: 85.9 fL (ref 80.0–100.0)
Platelets: 421 10*3/uL — ABNORMAL HIGH (ref 150–400)
RBC: 5.39 MIL/uL — ABNORMAL HIGH (ref 3.87–5.11)
RDW: 14.2 % (ref 11.5–15.5)
WBC: 12.6 10*3/uL — ABNORMAL HIGH (ref 4.0–10.5)
nRBC: 0 % (ref 0.0–0.2)

## 2020-02-28 LAB — URINE DRUG SCREEN, QUALITATIVE (ARMC ONLY)
Amphetamines, Ur Screen: NOT DETECTED
Barbiturates, Ur Screen: NOT DETECTED
Benzodiazepine, Ur Scrn: NOT DETECTED
Cannabinoid 50 Ng, Ur ~~LOC~~: NOT DETECTED
Cocaine Metabolite,Ur ~~LOC~~: NOT DETECTED
MDMA (Ecstasy)Ur Screen: NOT DETECTED
Methadone Scn, Ur: NOT DETECTED
Opiate, Ur Screen: POSITIVE — AB
Phencyclidine (PCP) Ur S: NOT DETECTED
Tricyclic, Ur Screen: NOT DETECTED

## 2020-02-28 LAB — TROPONIN I (HIGH SENSITIVITY)
Troponin I (High Sensitivity): 3 ng/L (ref <18)
Troponin I (High Sensitivity): 4 ng/L (ref <18)

## 2020-02-28 LAB — MONONUCLEOSIS SCREEN: Mono Screen: NEGATIVE

## 2020-02-28 LAB — RESP PANEL BY RT-PCR (FLU A&B, COVID) ARPGX2
Influenza A by PCR: NEGATIVE
Influenza B by PCR: NEGATIVE
SARS Coronavirus 2 by RT PCR: NEGATIVE

## 2020-02-28 LAB — POC URINE PREG, ED: Preg Test, Ur: NEGATIVE

## 2020-02-28 LAB — HCG, QUANTITATIVE, PREGNANCY: hCG, Beta Chain, Quant, S: <1 m[IU]/mL (ref <5)

## 2020-02-28 LAB — ETHANOL: Alcohol, Ethyl (B): <10 mg/dL (ref <10)

## 2020-02-28 LAB — LIPASE, BLOOD: Lipase: 17 U/L (ref 11–51)

## 2020-02-28 MED ORDER — HYDROCODONE-ACETAMINOPHEN 5-325 MG PO TABS: 1.0000 | ORAL_TABLET | Freq: Four times a day (QID) | ORAL | 0 refills | Status: DC | PRN

## 2020-02-28 MED ORDER — MORPHINE SULFATE (PF) 4 MG/ML IV SOLN
4.0000 mg | Freq: Once | INTRAVENOUS | Status: AC
  Administered 2020-02-28: 4 mg via INTRAVENOUS
  Filled 2020-02-28: qty 1

## 2020-02-28 MED ORDER — ONDANSETRON HCL 4 MG/2ML IJ SOLN
4.0000 mg | Freq: Once | INTRAMUSCULAR | Status: AC
  Administered 2020-02-28: 4 mg via INTRAVENOUS
  Filled 2020-02-28: qty 2

## 2020-02-28 MED ORDER — AMLODIPINE BESYLATE 5 MG PO TABS: 5.0000 mg | ORAL_TABLET | Freq: Every day | ORAL | 0 refills | Status: DC

## 2020-02-28 MED ORDER — SODIUM CHLORIDE 0.9 % IV BOLUS
1000.0000 mL | Freq: Once | INTRAVENOUS | Status: AC
  Administered 2020-02-28: 1000 mL via INTRAVENOUS

## 2020-02-28 MED ORDER — IOHEXOL 350 MG/ML SOLN
75.0000 mL | Freq: Once | INTRAVENOUS | Status: AC | PRN
  Administered 2020-02-28: 75 mL via INTRAVENOUS

## 2020-02-28 MED ORDER — IBUPROFEN 800 MG PO TABS: 800.0000 mg | ORAL_TABLET | Freq: Three times a day (TID) | ORAL | 0 refills | Status: AC | PRN

## 2020-02-28 NOTE — ED Notes (Signed)
Patient states pain, nausea and SHOB has decreased. Patient appears to be less flushed and panicky.  Pt denies needs at this time.

## 2020-02-28 NOTE — Discharge Instructions (Signed)
1.  Stop taking Lisinopril as needed.  Instead start Amlodipine 5mg  daily (#30). 2.  You may take Naprosyn 500mg  twice daily for pain, Percocet (#15) as needed for more severe pain. 3.  Apply moist heat to affected area several times daily. 4.  Return to the ER for worsening symptoms, persistent vomiting, difficulty breathing or other concerns.

## 2020-02-28 NOTE — ED Notes (Addendum)
Patient transported to CT 

## 2020-02-28 NOTE — ED Notes (Signed)
Patient in stretcher, mother bedside.  Patient C/o radiating CP to lower back and L side. Pt c/o SHOB, N/V, expiratory wheezes noted upon ascultation.  Patient states she was "just sitting when the CP came on".

## 2020-02-28 NOTE — ED Provider Notes (Signed)
[facility] Emergency Department Provider Note   ____________________________________________   Event Date/Time   First MD Initiated Contact with Patient 02/28/20 0141     (approximate)  I have reviewed the triage vital signs and the nursing notes.   HISTORY  Chief Complaint Chest Pain    HPI Paige Serrano is a 19 y.o. female who presents to the ED from home with a chief complaint of chest pain, shortness of breath and vomiting.  Patient reports sharp left-sided chest pain beginning approximately 10 PM while at rest radiating to her back upper abdomen, neck and left arm.  Physically painful to move left arm and movement of trunk.  Symptoms not associated with diaphoresis, palpitations or dizziness.  Almost finished with a 7-day course of Omnicef for sinusitis/URI symptoms which have improved.  States was vomiting yesterday, not today.  Denies dysuria or diarrhea.  Denies COVID-19 exposure; patient is unvaccinated.  Reports history of hypertension and states she takes Lisinopril prn.     Past Medical History:  Diagnosis Date  . GERD (gastroesophageal reflux disease)    H/O  . Ovarian cyst     Patient Active Problem List   Diagnosis Date Noted  . Endometrial mass 01/02/2018  . Pelvic pain 01/02/2018    Past Surgical History:  Procedure Laterality Date  . CHROMOPERTUBATION N/A 01/02/2018   Procedure: CHROMOPERTUBATION;  Surgeon: Ward, Elenora Fender, MD;  Location: [facility] ORS;  Service: Gynecology;  Laterality: N/A;  . HYSTEROSCOPY WITH D & C N/A 01/02/2018   Procedure: DILATATION AND CURETTAGE /HYSTEROSCOPY;  Surgeon: Ward, Elenora Fender, MD;  Location: [facility] ORS;  Service: Gynecology;  Laterality: N/A;  . INTRAUTERINE DEVICE (IUD) INSERTION N/A 01/02/2018   Procedure: INTRAUTERINE DEVICE (IUD) INSERTION;  Surgeon: Ward, Elenora Fender, MD;  Location: [facility] OR[facility];  Service: Gynecology;  Laterality: N/A;  . LAPAROSCOPIC UNILATERAL SALPINGECTOMY Right 01/02/2018    Procedure: LAPAROSCOPIC UNILATERAL SALPINGECTOMY;  Surgeon: Ward, Elenora Fender, MD;  Location: [facility] ORS;  Service: Gynecology;  Laterality: Right;  . LAPAROSCOPY N/A 01/02/2018   Procedure: LAPAROSCOPY DIAGNOSTIC;  Surgeon: Ward, Elenora Fender, MD;  Location: [facility];  Service: Gynecology;  Laterality: N/A;  . NO PAST SURGERIES      Prior to Admission medications   Medication Sig Start Date End Date Taking? Authorizing Provider  amLODipine (NORVASC) 5 MG tablet Take 1 tablet (5 mg total) by mouth daily. 02/28/20   Irean Hong, MD  HYDROcodone-acetaminophen (NORCO) 5-325 MG tablet Take 1 tablet by mouth every 6 (six) hours as needed for moderate pain. 02/28/20   Irean Hong, MD  ibuprofen (ADVIL) 800 MG tablet Take 1 tablet (800 mg total) by mouth every 8 (eight) hours as needed for moderate pain. 02/28/20   Irean Hong, MD    Allergies Patient has no known allergies.  No family history on file.  Social History Social History   Tobacco Use  . Smoking status: Never Smoker  . Smokeless tobacco: Never Used  Vaping Use  . Vaping Use: Never used  Substance Use Topics  . Alcohol use: Never  . Drug use: Never    Review of Systems  Constitutional: No fever/chills Eyes: No visual changes. ENT: No sore throat. Cardiovascular: Positive for chest pain. Respiratory: Positive for shortness of breath. Gastrointestinal: Positive for abdominal pain, nausea and vomiting.  No diarrhea.  No constipation. Genitourinary: Negative for dysuria. Musculoskeletal: Positive for left arm pain.  Negative for back pain. Skin: Negative for rash. Neurological: Negative for  headaches, focal weakness or numbness.   ____________________________________________   PHYSICAL EXAM:  VITAL SIGNS: ED Triage Vitals [02/28/20 0127]  Enc Vitals Group     BP (!) 158/112     Pulse Rate (!) 125     Resp (!) 24     Temp 98.7 F (37.1 C)     Temp Source Oral     SpO2 98 %     Weight 260 lb (117.9 kg)      Height 5\' 3"  (1.6 m)     Head Circumference      Peak Flow      Pain Score 7     Pain Loc      Pain Edu?      Excl. in GC?     Constitutional: Alert and oriented.  Anxious appearing and in mild acute distress. Eyes: Conjunctivae are normal. PERRL. EOMI. Head: Atraumatic. Nose: No congestion/rhinnorhea. Mouth/Throat: Mucous membranes are moist.   Neck: No stridor.   Cardiovascular: Tachycardic rate, regular rhythm. Grossly normal heart sounds.  Good peripheral circulation. Respiratory: Increased respiratory effort.  No retractions. Lungs CTAB.  Left anterior chest wall tender to palpation and with movement of trunk. Gastrointestinal: Soft and mildly tender to palpation epigastrium and left upper quadrant without rebound or guarding. No distention. No abdominal bruits. No CVA tenderness. Musculoskeletal:  LUE painful to range.  No obvious swelling or deformities.  2+ radial pulse.  Brisk, less than 5-second capillary refill. No lower extremity tenderness nor edema.  No joint effusions. Neurologic:  Normal speech and language. No gross focal neurologic deficits are appreciated. No gait instability. Skin:  Skin is warm, dry and intact. No rash noted. Psychiatric: Mood and affect are normal. Speech and behavior are normal.  ____________________________________________   LABS (all labs ordered are listed, but only abnormal results are displayed)  Labs Reviewed  CBC - Abnormal; Notable for the following components:      Result Value   WBC 12.6 (*)    RBC 5.39 (*)    Hemoglobin 15.1 (*)    HCT 46.3 (*)    Platelets 421 (*)    All other components within normal limits  COMPREHENSIVE METABOLIC PANEL - Abnormal; Notable for the following components:   Sodium 134 (*)    Potassium 3.4 (*)    CO2 20 (*)    Glucose, Bld 112 (*)    Total Bilirubin 2.7 (*)    All other components within normal limits  URINALYSIS, COMPLETE (UACMP) WITH MICROSCOPIC - Abnormal; Notable for the following  components:   Color, Urine AMBER (*)    APPearance HAZY (*)    Ketones, ur 20 (*)    Protein, ur 30 (*)    Bacteria, UA RARE (*)    All other components within normal limits  URINE DRUG SCREEN, QUALITATIVE ([facility] ONLY) - Abnormal; Notable for the following components:   Opiate, Ur Screen POSITIVE (*)    All other components within normal limits  RESP PANEL BY RT-PCR (FLU A&B, COVID) ARPGX2  LIPASE, BLOOD  ETHANOL  HCG, QUANTITATIVE, PREGNANCY  MONONUCLEOSIS SCREEN  POC URINE PREG, ED  TROPONIN I (HIGH SENSITIVITY)  TROPONIN I (HIGH SENSITIVITY)   ____________________________________________  EKG  ED ECG REPORT I, Elisabeth Strom J, the attending physician, personally viewed and interpreted this ECG.   Date: 02/28/2020  EKG Time: 0124  Rate: 116  Rhythm: sinus tachycardia  Axis: Normal  Intervals:none  ST&T Change: Nonspecific  ____________________________________________  RADIOLOGY 0125, personally viewed and  evaluated these images (plain radiographs) as part of my medical decision making, as well as reviewing the written report by the radiologist.  ED MD interpretation: No acute cardiopulmonary process  Official radiology report(s): DG Chest 2 View  Result Date: 02/28/2020 CLINICAL DATA:  The left-sided chest pain and shortness of breath. EXAM: CHEST - 2 VIEW COMPARISON:  None. FINDINGS: The heart size and mediastinal contours are within normal limits. Both lungs are clear. The visualized skeletal structures are unremarkable. IMPRESSION: No active cardiopulmonary disease. Electronically Signed   By: Aram Candelahaddeus  Houston M.D.   On: 02/28/2020 01:43   CT Angio Chest PE W/Cm &/Or Wo Cm  Result Date: 02/28/2020 CLINICAL DATA:  Chest pain and shortness of breath. EXAM: CT ANGIOGRAPHY CHEST WITH CONTRAST TECHNIQUE: Multidetector CT imaging of the chest was performed using the standard protocol during bolus administration of intravenous contrast. Multiplanar CT image  reconstructions and MIPs were obtained to evaluate the vascular anatomy. CONTRAST:  75mL OMNIPAQUE IOHEXOL 350 MG/ML SOLN COMPARISON:  None. FINDINGS: Cardiovascular: The heart size is normal. No substantial pericardial effusion. No thoracic aortic aneurysm. Motion degraded study shows no filling defect in the pulmonary arteries to suggest the presence of an acute pulmonary embolus. Mediastinum/Nodes: No mediastinal lymphadenopathy. There is no hilar lymphadenopathy. The esophagus has normal imaging features. There is no axillary lymphadenopathy. Lungs/Pleura: Mosaic attenuation in the dependent lungs is nonspecific but likely reflects air trapping. No focal airspace consolidation. No suspicious nodule or mass. No pleural effusion. Upper Abdomen: Unremarkable. Musculoskeletal: No worrisome lytic or sclerotic osseous abnormality. Review of the MIP images confirms the above findings. IMPRESSION: 1. No CT evidence for acute pulmonary embolus. 2. No other acute findings in the chest. Electronically Signed   By: Kennith CenterEric  Mansell M.D.   On: 02/28/2020 05:29    ____________________________________________   PROCEDURES  Procedure(s) performed (including Critical Care):  .1-3 Lead EKG Interpretation Performed by: Irean HongSung, Kei Langhorst J, MD Authorized by: Irean HongSung, Muskaan Smet J, MD     Interpretation: abnormal     ECG rate:  101   ECG rate assessment: tachycardic     Rhythm: sinus tachycardia     Ectopy: none     Conduction: normal   Comments:     Patient placed on cardiac monitor to evaluate for arrhythmias     ____________________________________________   INITIAL IMPRESSION / ASSESSMENT AND PLAN / ED COURSE  As part of my medical decision making, I reviewed the following data within the electronic MEDICAL RECORD NUMBER History obtained from family, Nursing notes reviewed and incorporated, Labs reviewed, EKG interpreted, Old chart reviewed (02/20/2020 office visit), Radiograph reviewed and Notes from prior ED visits      19 year old female presenting with chest pain, shortness of breath, nausea/vomiting. Differential diagnosis includes, but is not limited to, ACS, aortic dissection, pulmonary embolism, cardiac tamponade, pneumothorax, pneumonia, pericarditis, myocarditis, GI-related causes including esophagitis/gastritis, and musculoskeletal chest wall pain.    We will obtain cardiac work-up, EKG, chest x-ray.  Pressure taken in both arms equal.  Consider CTA chest.  Administer IV morphine for pain paired with IV Zofran for nausea.  Will reassess.  Clinical Course as of 02/28/20 [id number]  Sat Feb 28, 2020  [id number] Patient appears much more comfortable.  Updated patient and mother on all test results thus far; will repeat troponin and obtain CTA chest to evaluate for PE. [JS]  0540 Updated patient and mother of all test results.  Will discharge home with prescription for NSAIDs and analgesia.  Will also change her  antihypertensive from Lisinopril prn to amlodipine 5 mg daily.  She will follow up with her PCP.  Strict return precautions given.  Both verbalized understanding and agree with plan of care. [JS]    Clinical Course User Index [JS] Irean Hong, MD     ____________________________________________   FINAL CLINICAL IMPRESSION(S) / ED DIAGNOSES  Final diagnoses:  Nonspecific chest pain  Dehydration  Chest wall pain  Secondary hypertension     ED Discharge Orders         Ordered    amLODipine (NORVASC) 5 MG tablet  Daily        02/28/20 0543    HYDROcodone-acetaminophen (NORCO) 5-325 MG tablet  Every 6 hours PRN        02/28/20 0543    ibuprofen (ADVIL) 800 MG tablet  Every 8 hours PRN        02/28/20 0543          *Please note:  JANNAH GUARDIOLA was evaluated in Emergency Department on 02/28/2020 for the symptoms described in the history of present illness. She was evaluated in the context of the global COVID-19 pandemic, which necessitated consideration that the patient might be at risk for  infection with the SARS-CoV-2 virus that causes COVID-19. Institutional protocols and algorithms that pertain to the evaluation of patients at risk for COVID-19 are in a state of rapid change based on information released by regulatory bodies including the CDC and federal and state organizations. These policies and algorithms were followed during the patient's care in the ED.  Some ED evaluations and interventions may be delayed as a result of limited staffing during and the pandemic.*   Note:  This document was prepared using Dragon voice recognition software and may include unintentional dictation errors.   Irean Hong, MD 02/28/20 [phone]

## 2020-02-28 NOTE — ED Triage Notes (Signed)
Pt with left side chest pain, vomiting, shortness of breath that began yesterday. Pt states pain radiates to back, upper abd, neck and down left arm. Pt states left arm is pain ful with moving.

## 2020-09-01 ENCOUNTER — Other Ambulatory Visit: Payer: Self-pay | Admitting: Family Medicine

## 2020-09-01 ENCOUNTER — Ambulatory Visit
Admission: RE | Admit: 2020-09-01 | Discharge: 2020-09-01 | Disposition: A | Discharge status: Home / Self Care | Payer: Managed Care, Other (non HMO) | Source: Ambulatory Visit | Attending: Family Medicine | Admitting: Family Medicine

## 2020-09-01 ENCOUNTER — Other Ambulatory Visit: Payer: Self-pay

## 2020-09-01 DIAGNOSIS — R1011 Right upper quadrant pain: Secondary | ICD-10-CM | POA: Diagnosis present

## 2020-09-01 DIAGNOSIS — R109 Unspecified abdominal pain: Secondary | ICD-10-CM | POA: Diagnosis present

## 2020-09-20 ENCOUNTER — Other Ambulatory Visit: Payer: Self-pay | Admitting: Family Medicine

## 2020-09-20 ENCOUNTER — Emergency Department
Admission: EM | Admit: 2020-09-20 | Discharge: 2020-09-20 | Disposition: A | Discharge status: Home / Self Care | Payer: Managed Care, Other (non HMO) | Attending: Emergency Medicine | Admitting: Emergency Medicine

## 2020-09-20 ENCOUNTER — Other Ambulatory Visit: Payer: Self-pay

## 2020-09-20 DIAGNOSIS — R1011 Right upper quadrant pain: Secondary | ICD-10-CM | POA: Diagnosis present

## 2020-09-20 DIAGNOSIS — D72829 Elevated white blood cell count, unspecified: Secondary | ICD-10-CM | POA: Insufficient documentation

## 2020-09-20 DIAGNOSIS — R112 Nausea with vomiting, unspecified: Secondary | ICD-10-CM | POA: Insufficient documentation

## 2020-09-20 DIAGNOSIS — K219 Gastro-esophageal reflux disease without esophagitis: Secondary | ICD-10-CM | POA: Insufficient documentation

## 2020-09-20 LAB — URINALYSIS, COMPLETE (UACMP) WITH MICROSCOPIC
Bilirubin Urine: NEGATIVE
Glucose, UA: NEGATIVE mg/dL
Hgb urine dipstick: NEGATIVE
Ketones, ur: NEGATIVE mg/dL
Nitrite: NEGATIVE
Protein, ur: NEGATIVE mg/dL
Specific Gravity, Urine: 1.025 (ref 1.005–1.030)
pH: 6 (ref 5.0–8.0)

## 2020-09-20 LAB — CBC
HCT: 45.8 % (ref 36.0–46.0)
Hemoglobin: 15.4 g/dL — ABNORMAL HIGH (ref 12.0–15.0)
MCH: 30.1 pg (ref 26.0–34.0)
MCHC: 33.6 g/dL (ref 30.0–36.0)
MCV: 89.5 fL (ref 80.0–100.0)
Platelets: 435 10*3/uL — ABNORMAL HIGH (ref 150–400)
RBC: 5.12 MIL/uL — ABNORMAL HIGH (ref 3.87–5.11)
RDW: 13.6 % (ref 11.5–15.5)
WBC: 11.2 10*3/uL — ABNORMAL HIGH (ref 4.0–10.5)
nRBC: 0 % (ref 0.0–0.2)

## 2020-09-20 LAB — COMPREHENSIVE METABOLIC PANEL
ALT: 27 U/L (ref 0–44)
AST: 18 U/L (ref 15–41)
Albumin: 4.4 g/dL (ref 3.5–5.0)
Alkaline Phosphatase: 49 U/L (ref 38–126)
Anion gap: 8 (ref 5–15)
BUN: 7 mg/dL (ref 6–20)
CO2: 26 mmol/L (ref 22–32)
Calcium: 9.4 mg/dL (ref 8.9–10.3)
Chloride: 105 mmol/L (ref 98–111)
Creatinine, Ser: 0.71 mg/dL (ref 0.44–1.00)
GFR, Estimated: >60 mL/min (ref >60)
Glucose, Bld: 101 mg/dL — ABNORMAL HIGH (ref 70–99)
Potassium: 3.8 mmol/L (ref 3.5–5.1)
Sodium: 139 mmol/L (ref 135–145)
Total Bilirubin: 1.1 mg/dL (ref 0.3–1.2)
Total Protein: 7.3 g/dL (ref 6.5–8.1)

## 2020-09-20 LAB — POC URINE PREG, ED: Preg Test, Ur: NEGATIVE

## 2020-09-20 LAB — LIPASE, BLOOD: Lipase: 30 U/L (ref 11–51)

## 2020-09-20 MED ORDER — ALUM & MAG HYDROXIDE-SIMETH 200-200-20 MG/5ML PO SUSP
30.0000 mL | Freq: Once | ORAL | Status: AC
  Administered 2020-09-20: 30 mL via ORAL
  Filled 2020-09-20: qty 30

## 2020-09-20 MED ORDER — LIDOCAINE VISCOUS HCL 2 % MT SOLN
15.0000 mL | Freq: Once | OROMUCOSAL | Status: AC
  Administered 2020-09-20: 15 mL via ORAL
  Filled 2020-09-20: qty 15

## 2020-09-20 MED ORDER — ONDANSETRON 4 MG PO TBDP
4.0000 mg | ORAL_TABLET | Freq: Once | ORAL | Status: AC
  Administered 2020-09-20: 4 mg via ORAL
  Filled 2020-09-20: qty 1

## 2020-09-20 MED ORDER — PROMETHAZINE HCL 25 MG/ML IJ SOLN
25.0000 mg | Freq: Four times a day (QID) | INTRAMUSCULAR | Status: DC | PRN
  Administered 2020-09-20: 25 mg via INTRAMUSCULAR
  Filled 2020-09-20: qty 1

## 2020-09-20 NOTE — ED Provider Notes (Signed)
[facility] Emergency Department Provider Note   ____________________________________________   I have reviewed the triage vital signs and the nursing notes.   HISTORY  Chief Complaint Abdominal Pain   History limited by: Not limited   HPI Paige Serrano is a 20 y.o. female who presents to the emergency department today because of concern for continued RUQ pain. The patient states that the pain started a few weeks ago. The patient states that she has had associated nausea and vomiting. The patient has been trying to keep track of her food intake to see if it is related to the pain but has not noticed any significant pattern. The patient denies any bloody stool. Has been seen in the emergency department twice recently for this without obvious source of pain. Saw PCP earlier today who has ordered a HIDA scan. The patient states that she has tried some over the counter pain medication as well as Toradol which was prescribed from one of the ED visits without significant relief. The patient states she was given omeprazole but has not been taking it regularly. Did feel she had a fever last week for one day.   Records reviewed. Per medical record review patient has a history of two ED visits for similar symptoms in the past week. ED work up included MRCP, CT abd/pel and RUQ [place]. It appears no clear etiology of the pain was identified.   Past Medical History:  Diagnosis Date   GERD (gastroesophageal reflux disease)    H/O   Ovarian cyst     Patient Active Problem List   Diagnosis Date Noted   Endometrial mass 01/02/2018   Pelvic pain 01/02/2018    Past Surgical History:  Procedure Laterality Date   CHROMOPERTUBATION N/A 01/02/2018   Procedure: CHROMOPERTUBATION;  Surgeon: Ward, Elenora Fender, MD;  Location: [facility] ORS;  Service: Gynecology;  Laterality: N/A;   HYSTEROSCOPY WITH D & C N/A 01/02/2018   Procedure: DILATATION AND CURETTAGE /HYSTEROSCOPY;  Surgeon: Ward,  Elenora Fender, MD;  Location: [facility] OR[facility];  Service: Gynecology;  Laterality: N/A;   INTRAUTERINE DEVICE (IUD) INSERTION N/A 01/02/2018   Procedure: INTRAUTERINE DEVICE (IUD) INSERTION;  Surgeon: Ward, Elenora Fender, MD;  Location: [facility] OR[facility];  Service: Gynecology;  Laterality: N/A;   LAPAROSCOPIC UNILATERAL SALPINGECTOMY Right 01/02/2018   Procedure: LAPAROSCOPIC UNILATERAL SALPINGECTOMY;  Surgeon: Ward, Elenora Fender, MD;  Location: [facility] OR[facility];  Service: Gynecology;  Laterality: Right;   LAPAROSCOPY N/A 01/02/2018   Procedure: LAPAROSCOPY DIAGNOSTIC;  Surgeon: Ward, Elenora Fender, MD;  Location: [facility] OR[facility];  Service: Gynecology;  Laterality: N/A;   NO PAST SURGERIES      Prior to Admission medications   Medication Sig Start Date End Date Taking? Authorizing Provider  lisinopril-hydrochlorothiazide (ZESTORETIC) 10-12.5 MG tablet Take 1 tablet by mouth daily. 04/28/19 07/19/21 Yes [provider]  omeprazole (PRILOSEC) 20 MG capsule Take 1 capsule by mouth daily. 09/16/20 09/30/20 Yes [provider]  ondansetron (ZOFRAN-ODT) 4 MG disintegrating tablet Take by mouth. 09/16/20 09/23/20 Yes [provider]  amLODipine (NORVASC) 5 MG tablet Take 1 tablet (5 mg total) by mouth daily. 02/28/20   Irean Hong, MD  HYDROcodone-acetaminophen (NORCO) 5-325 MG tablet Take 1 tablet by mouth every 6 (six) hours as needed for moderate pain. 02/28/20   Irean Hong, MD  ibuprofen (ADVIL) 800 MG tablet Take 1 tablet (800 mg total) by mouth every 8 (eight) hours as needed for moderate pain. 02/28/20   Irean Hong, MD  Allergies Patient has no known allergies.  No family history on file.  Social History Social History   Tobacco Use   Smoking status: Never   Smokeless tobacco: Never  Vaping Use   Vaping Use: Never used  Substance Use Topics   Alcohol use: Never   Drug use: Never    Review of Systems Constitutional: No fever/chills Eyes: No visual changes. ENT: No sore throat. Cardiovascular:  Denies chest pain. Respiratory: Denies shortness of breath. Gastrointestinal: Positive for abdominal pain, nausea and vomiting.  Genitourinary: Negative for dysuria. Musculoskeletal: Negative for back pain. Skin: Negative for rash. Neurological: Negative for headaches, focal weakness or numbness.  ____________________________________________   PHYSICAL EXAM:  VITAL SIGNS: ED Triage Vitals  Enc Vitals Group     BP 09/20/20 1243 (!) 134/103     Pulse Rate 09/20/20 1243 78     Resp 09/20/20 1243 18     Temp 09/20/20 1243 98.4 F (36.9 C)     Temp src --      SpO2 09/20/20 1243 100 %     Weight --      Height --      Head Circumference --      Peak Flow --      Pain Score 09/20/20 1245 8   Constitutional: Alert and oriented.  Eyes: Conjunctivae are normal.  ENT      Head: Normocephalic and atraumatic.      Nose: No congestion/rhinnorhea.      Mouth/Throat: Mucous membranes are moist.      Neck: No stridor. Hematological/Lymphatic/Immunilogical: No cervical lymphadenopathy. Cardiovascular: Normal rate, regular rhythm.  No murmurs, rubs, or gallops.  Respiratory: Normal respiratory effort without tachypnea nor retractions. Breath sounds are clear and equal bilaterally. No wheezes/rales/rhonchi. Gastrointestinal: Soft and tender to palpation in the right upper quadrant.  Genitourinary: Deferred Musculoskeletal: Normal range of motion in all extremities. No lower extremity edema. Neurologic:  Normal speech and language. No gross focal neurologic deficits are appreciated.  Skin:  Skin is warm, dry and intact. No rash noted. Psychiatric: Mood and affect are normal. Speech and behavior are normal. Patient exhibits appropriate insight and judgment.  ____________________________________________    LABS (pertinent positives/negatives)  Upreg negative CMP wnl except glu 101 UA hazy, small leukocytes, rare bacteria, 6-10 squamous epi Lipase 30 CBC wbc 11.2, hgb 15.4, plt  435  ____________________________________________   EKG  None  ____________________________________________    RADIOLOGY  None  ____________________________________________   PROCEDURES  Procedures  ____________________________________________   INITIAL IMPRESSION / ASSESSMENT AND PLAN / ED COURSE  Pertinent labs & imaging results that were available during my care of the patient were reviewed by me and considered in my medical decision making (see chart for details).   Patient presented to the emergency department today because of concern for continued RUQ pain. It has been present over the past roughly 3 weeks. On exam patient is tender to palpation in the RUQ. The patient has had significant work up including CT abd/pel, RUQ [place] and MRCP within the past 4 days, without obvious etiology of the patient's symptoms. Blood work today with mild leukocytosis, however it appears improved over recent blood work at outside hospital. Additionally the patient without any elevated creatinine or ketonuria to suggest significant dehydration, or concerning electrolyte abnormality. At this time did not necessarily feel like repeat emergent imaging was necessary. Did discuss with patient that I would like to try pain and symptom control. Initially ordered GI cocktail in case patient was  suffering from ulcer/gastritis/duodenitis. I was then told by nursing staff that the patient was requesting discharge. In discussion with the patient she stated she did not want to try pain control at this facility. Would like to go to a different facility. Did discuss with patient that I would be happy to try symptomatic control however she did again decline. At this time will plan on discharging.   ____________________________________________   FINAL CLINICAL IMPRESSION(S) / ED DIAGNOSES  Final diagnoses:  Right upper quadrant abdominal pain     Note: This dictation was prepared with Dragon dictation. Any  transcriptional errors that result from this process are unintentional     Phineas Semen, MD 09/20/20 1439

## 2020-09-20 NOTE — ED Triage Notes (Addendum)
Pt comes with c/o RUQ pain N and V for few weeks. Pt states she has been evaluated for this but not relief.  Pt states she hasn't been able to keep anything down.

## 2020-09-20 NOTE — ED Notes (Signed)
Maalox and Lidocaine provided to patient for post zofran consumption.

## 2020-09-20 NOTE — ED Notes (Signed)
Patient arrives to the ED with complaints of RUQ pain, nausea, and vomiting intermittently x 2 weeks. Patient reports she was seen by her PCP, and was sent to the ED for a HIDA scan. Patient reports attempts were made to have the test outpatient, but they were unsuccessful. Patient reports several episodes of emesis today. Patient reports LMP 6/7-6/11.

## 2020-09-20 NOTE — ED Notes (Addendum)
Patient reports nausea continued post zofran, and she has not taken the Maalox at this time. Patient reports she would like to leave AMA at this time. Dr. Derrill Kay notified.

## 2020-09-20 NOTE — Discharge Instructions (Addendum)
Please seek medical attention for any high fevers, chest pain, shortness of breath, change in behavior, persistent vomiting, bloody stool or any other new or concerning symptoms.  

## 2020-09-21 ENCOUNTER — Other Ambulatory Visit: Payer: Managed Care, Other (non HMO)

## 2020-09-21 ENCOUNTER — Ambulatory Visit
Admission: RE | Admit: 2020-09-21 | Discharge: 2020-09-21 | Disposition: A | Discharge status: Home / Self Care | Payer: Managed Care, Other (non HMO) | Source: Ambulatory Visit | Attending: Family Medicine | Admitting: Family Medicine

## 2020-09-21 DIAGNOSIS — R1011 Right upper quadrant pain: Secondary | ICD-10-CM | POA: Diagnosis present

## 2020-09-21 MED ORDER — TECHNETIUM TC 99M MEBROFENIN IV KIT
5.0000 | PACK | Freq: Once | INTRAVENOUS | Status: AC | PRN
  Administered 2020-09-21: 5.03 via INTRAVENOUS

## 2020-09-24 ENCOUNTER — Encounter
Admission: EM | Disposition: A | Discharge status: Home / Self Care | Payer: Self-pay | Source: Home / Self Care | Attending: Surgery

## 2020-09-24 ENCOUNTER — Encounter: Payer: Self-pay | Admitting: Surgery

## 2020-09-24 ENCOUNTER — Ambulatory Visit
Admission: EM | Admit: 2020-09-24 | Discharge: 2020-09-24 | Disposition: A | Discharge status: Home / Self Care | Payer: Managed Care, Other (non HMO) | Attending: Surgery | Admitting: Surgery

## 2020-09-24 ENCOUNTER — Ambulatory Visit: Payer: Managed Care, Other (non HMO) | Admitting: Certified Registered"

## 2020-09-24 ENCOUNTER — Ambulatory Visit: Payer: Self-pay | Admitting: Surgery

## 2020-09-24 ENCOUNTER — Other Ambulatory Visit: Payer: Self-pay

## 2020-09-24 DIAGNOSIS — K8044 Calculus of bile duct with chronic cholecystitis without obstruction: Secondary | ICD-10-CM | POA: Diagnosis not present

## 2020-09-24 DIAGNOSIS — Z79899 Other long term (current) drug therapy: Secondary | ICD-10-CM | POA: Diagnosis not present

## 2020-09-24 DIAGNOSIS — K219 Gastro-esophageal reflux disease without esophagitis: Secondary | ICD-10-CM | POA: Diagnosis not present

## 2020-09-24 DIAGNOSIS — Z90721 Acquired absence of ovaries, unilateral: Secondary | ICD-10-CM | POA: Diagnosis not present

## 2020-09-24 DIAGNOSIS — Z975 Presence of (intrauterine) contraceptive device: Secondary | ICD-10-CM | POA: Insufficient documentation

## 2020-09-24 DIAGNOSIS — K811 Chronic cholecystitis: Secondary | ICD-10-CM

## 2020-09-24 HISTORY — DX: Headache, unspecified: R51.9

## 2020-09-24 HISTORY — DX: Essential (primary) hypertension: I10

## 2020-09-24 SURGERY — CHOLECYSTECTOMY, ROBOT-ASSISTED, LAPAROSCOPIC
Anesthesia: General | Site: Abdomen

## 2020-09-24 MED ORDER — CEFAZOLIN SODIUM-DEXTROSE 2-4 GM/100ML-% IV SOLN
2.0000 g | INTRAVENOUS | Status: AC
  Administered 2020-09-24: 2 g via INTRAVENOUS

## 2020-09-24 MED ORDER — DOCUSATE SODIUM 100 MG PO CAPS: 100.0000 mg | ORAL_CAPSULE | Freq: Two times a day (BID) | ORAL | 0 refills | Status: AC | PRN

## 2020-09-24 MED ORDER — HYDROCODONE-ACETAMINOPHEN 5-325 MG PO TABS
ORAL_TABLET | ORAL | Status: AC
  Administered 2020-09-24: 1 via ORAL
  Filled 2020-09-24: qty 1

## 2020-09-24 MED ORDER — LIDOCAINE HCL (PF) 1 % IJ SOLN
INTRAMUSCULAR | Status: AC
  Filled 2020-09-24: qty 30

## 2020-09-24 MED ORDER — MEPERIDINE HCL 25 MG/ML IJ SOLN: 6.2500 mg | INTRAMUSCULAR | Status: DC | PRN

## 2020-09-24 MED ORDER — PROPOFOL 10 MG/ML IV BOLUS
INTRAVENOUS | Status: DC | PRN
  Administered 2020-09-24: 200 mg via INTRAVENOUS

## 2020-09-24 MED ORDER — GABAPENTIN 300 MG PO CAPS: 300.0000 mg | ORAL_CAPSULE | ORAL | Status: AC

## 2020-09-24 MED ORDER — FENTANYL CITRATE (PF) 100 MCG/2ML IJ SOLN
INTRAMUSCULAR | Status: AC
  Administered 2020-09-24: 25 ug via INTRAVENOUS
  Filled 2020-09-24: qty 2

## 2020-09-24 MED ORDER — LIDOCAINE HCL (PF) 2 % IJ SOLN
INTRAMUSCULAR | Status: AC
  Filled 2020-09-24: qty 5

## 2020-09-24 MED ORDER — 0.9 % SODIUM CHLORIDE (POUR BTL) OPTIME
TOPICAL | Status: DC | PRN
  Administered 2020-09-24: 60 mL

## 2020-09-24 MED ORDER — ONDANSETRON HCL 4 MG/2ML IJ SOLN
INTRAMUSCULAR | Status: AC
  Filled 2020-09-24: qty 4

## 2020-09-24 MED ORDER — ORAL CARE MOUTH RINSE: 15.0000 mL | Freq: Once | OROMUCOSAL | Status: AC

## 2020-09-24 MED ORDER — HYDROCODONE-ACETAMINOPHEN 5-325 MG PO TABS: 1.0000 | ORAL_TABLET | Freq: Four times a day (QID) | ORAL | 0 refills | Status: DC | PRN

## 2020-09-24 MED ORDER — ROCURONIUM BROMIDE 10 MG/ML (PF) SYRINGE
PREFILLED_SYRINGE | INTRAVENOUS | Status: AC
  Filled 2020-09-24: qty 10

## 2020-09-24 MED ORDER — ROCURONIUM BROMIDE 100 MG/10ML IV SOLN
INTRAVENOUS | Status: DC | PRN
  Administered 2020-09-24: 50 mg via INTRAVENOUS

## 2020-09-24 MED ORDER — CEFAZOLIN SODIUM-DEXTROSE 2-4 GM/100ML-% IV SOLN
INTRAVENOUS | Status: AC
  Filled 2020-09-24: qty 100

## 2020-09-24 MED ORDER — CHLORHEXIDINE GLUCONATE CLOTH 2 % EX PADS: 6.0000 | MEDICATED_PAD | Freq: Once | CUTANEOUS | Status: DC

## 2020-09-24 MED ORDER — MIDAZOLAM HCL 2 MG/2ML IJ SOLN
INTRAMUSCULAR | Status: AC
  Filled 2020-09-24: qty 2

## 2020-09-24 MED ORDER — ONDANSETRON HCL 4 MG/2ML IJ SOLN
4.0000 mg | Freq: Once | INTRAMUSCULAR | Status: AC | PRN
  Administered 2020-09-24: 4 mg via INTRAVENOUS

## 2020-09-24 MED ORDER — HYDROCODONE-ACETAMINOPHEN 5-325 MG PO TABS: 1.0000 | ORAL_TABLET | Freq: Once | ORAL | Status: AC

## 2020-09-24 MED ORDER — CELECOXIB 200 MG PO CAPS: 200.0000 mg | ORAL_CAPSULE | ORAL | Status: AC

## 2020-09-24 MED ORDER — ACETAMINOPHEN 500 MG PO TABS: 1000.0000 mg | ORAL_TABLET | ORAL | Status: AC

## 2020-09-24 MED ORDER — BUPIVACAINE-EPINEPHRINE 0.5% -1:200000 IJ SOLN
INTRAMUSCULAR | Status: DC | PRN
  Administered 2020-09-24: 12.5 mL

## 2020-09-24 MED ORDER — INDOCYANINE GREEN 25 MG IV SOLR
1.2500 mg | Freq: Once | INTRAVENOUS | Status: AC
  Administered 2020-09-24: 1.25 mg via INTRAVENOUS
  Filled 2020-09-24: qty 0.5

## 2020-09-24 MED ORDER — DEXMEDETOMIDINE (PRECEDEX) IN NS 20 MCG/5ML (4 MCG/ML) IV SYRINGE
PREFILLED_SYRINGE | INTRAVENOUS | Status: AC
  Filled 2020-09-24: qty 5

## 2020-09-24 MED ORDER — MIDAZOLAM HCL 2 MG/2ML IJ SOLN
INTRAMUSCULAR | Status: DC | PRN
  Administered 2020-09-24: 2 mg via INTRAVENOUS

## 2020-09-24 MED ORDER — ONDANSETRON HCL 4 MG/2ML IJ SOLN
INTRAMUSCULAR | Status: AC
  Filled 2020-09-24: qty 2

## 2020-09-24 MED ORDER — PROPOFOL 10 MG/ML IV BOLUS
INTRAVENOUS | Status: AC
  Filled 2020-09-24: qty 40

## 2020-09-24 MED ORDER — ACETAMINOPHEN 325 MG PO TABS: 650.0000 mg | ORAL_TABLET | Freq: Three times a day (TID) | ORAL | 0 refills | Status: AC | PRN

## 2020-09-24 MED ORDER — DEXAMETHASONE SODIUM PHOSPHATE 10 MG/ML IJ SOLN
INTRAMUSCULAR | Status: AC
  Filled 2020-09-24: qty 1

## 2020-09-24 MED ORDER — LIDOCAINE HCL 1 % IJ SOLN
INTRAMUSCULAR | Status: DC | PRN
  Administered 2020-09-24: 12.5 mL

## 2020-09-24 MED ORDER — LIDOCAINE HCL (CARDIAC) PF 100 MG/5ML IV SOSY
PREFILLED_SYRINGE | INTRAVENOUS | Status: DC | PRN
  Administered 2020-09-24: 100 mg via INTRAVENOUS

## 2020-09-24 MED ORDER — FENTANYL CITRATE (PF) 100 MCG/2ML IJ SOLN
INTRAMUSCULAR | Status: DC | PRN
  Administered 2020-09-24: 100 ug via INTRAVENOUS

## 2020-09-24 MED ORDER — CHLORHEXIDINE GLUCONATE 0.12 % MT SOLN
OROMUCOSAL | Status: AC
  Administered 2020-09-24: 15 mL via OROMUCOSAL
  Filled 2020-09-24: qty 15

## 2020-09-24 MED ORDER — FENTANYL CITRATE (PF) 100 MCG/2ML IJ SOLN
INTRAMUSCULAR | Status: AC
  Administered 2020-09-24: 50 ug via INTRAVENOUS
  Filled 2020-09-24: qty 2

## 2020-09-24 MED ORDER — DEXMEDETOMIDINE (PRECEDEX) IN NS 20 MCG/5ML (4 MCG/ML) IV SYRINGE
PREFILLED_SYRINGE | INTRAVENOUS | Status: DC | PRN
  Administered 2020-09-24: 8 ug via INTRAVENOUS
  Administered 2020-09-24: 12 ug via INTRAVENOUS

## 2020-09-24 MED ORDER — BUPIVACAINE-EPINEPHRINE (PF) 0.5% -1:200000 IJ SOLN
INTRAMUSCULAR | Status: AC
  Filled 2020-09-24: qty 30

## 2020-09-24 MED ORDER — ONDANSETRON HCL 4 MG/2ML IJ SOLN
INTRAMUSCULAR | Status: DC | PRN
  Administered 2020-09-24: 4 mg via INTRAVENOUS

## 2020-09-24 MED ORDER — ACETAMINOPHEN 500 MG PO TABS
ORAL_TABLET | ORAL | Status: AC
  Administered 2020-09-24: 1000 mg via ORAL
  Filled 2020-09-24: qty 2

## 2020-09-24 MED ORDER — FENTANYL CITRATE (PF) 100 MCG/2ML IJ SOLN
INTRAMUSCULAR | Status: AC
  Filled 2020-09-24: qty 4

## 2020-09-24 MED ORDER — CELECOXIB 200 MG PO CAPS
ORAL_CAPSULE | ORAL | Status: AC
  Administered 2020-09-24: 200 mg via ORAL
  Filled 2020-09-24: qty 1

## 2020-09-24 MED ORDER — LACTATED RINGERS IV SOLN
INTRAVENOUS | Status: DC
  Administered 2020-09-24: 10 mL/h via INTRAVENOUS

## 2020-09-24 MED ORDER — DEXAMETHASONE SODIUM PHOSPHATE 10 MG/ML IJ SOLN
INTRAMUSCULAR | Status: DC | PRN
  Administered 2020-09-24: 10 mg via INTRAVENOUS

## 2020-09-24 MED ORDER — SUGAMMADEX SODIUM 200 MG/2ML IV SOLN
INTRAVENOUS | Status: DC | PRN
Start: 2020-09-24 — End: 2020-09-24
  Administered 2020-09-24: 200 mg via INTRAVENOUS

## 2020-09-24 MED ORDER — CHLORHEXIDINE GLUCONATE 0.12 % MT SOLN: 15.0000 mL | Freq: Once | OROMUCOSAL | Status: AC

## 2020-09-24 MED ORDER — FENTANYL CITRATE (PF) 100 MCG/2ML IJ SOLN
25.0000 ug | INTRAMUSCULAR | Status: DC | PRN
  Administered 2020-09-24 (×3): 25 ug via INTRAVENOUS

## 2020-09-24 MED ORDER — GABAPENTIN 300 MG PO CAPS
ORAL_CAPSULE | ORAL | Status: AC
  Administered 2020-09-24: 300 mg via ORAL
  Filled 2020-09-24: qty 1

## 2020-09-24 SURGICAL SUPPLY — 57 items
ADH SKN CLS APL DERMABOND .7 (GAUZE/BANDAGES/DRESSINGS) ×1
ANCHOR TIS RET SYS 235ML (MISCELLANEOUS) ×2 IMPLANT
APL PRP STRL LF DISP 70% ISPRP (MISCELLANEOUS) ×1
BAG INFUSER PRESSURE 100CC (MISCELLANEOUS) IMPLANT
BAG TISS RTRVL C235 10X14 (MISCELLANEOUS) ×1
BLADE SURG SZ11 CARB STEEL (BLADE) ×2 IMPLANT
CANISTER SUCT 1200ML W/VALVE (MISCELLANEOUS) IMPLANT
CANNULA REDUC XI 12-8 STAPL (CANNULA) ×1
CANNULA REDUCER 12-8 DVNC XI (CANNULA) ×1 IMPLANT
CATH REDDICK CHOLANGI 4FR 50CM (CATHETERS) IMPLANT
CHLORAPREP W/TINT 26 (MISCELLANEOUS) ×2 IMPLANT
CLIP VESOLOCK MED LG 6/CT (CLIP) ×2 IMPLANT
COVER TIP SHEARS 8 DVNC (MISCELLANEOUS) IMPLANT
COVER TIP SHEARS 8MM DA VINCI (MISCELLANEOUS)
DECANTER SPIKE VIAL GLASS SM (MISCELLANEOUS) ×4 IMPLANT
DEFOGGER SCOPE WARMER CLEARIFY (MISCELLANEOUS) ×2 IMPLANT
DERMABOND ADVANCED (GAUZE/BANDAGES/DRESSINGS) ×1
DERMABOND ADVANCED .7 DNX12 (GAUZE/BANDAGES/DRESSINGS) ×1 IMPLANT
DRAPE ARM DVNC X/XI (DISPOSABLE) ×4 IMPLANT
DRAPE C-ARM XRAY 36X54 (DRAPES) IMPLANT
DRAPE COLUMN DVNC XI (DISPOSABLE) ×1 IMPLANT
DRAPE DA VINCI XI ARM (DISPOSABLE) ×4
DRAPE DA VINCI XI COLUMN (DISPOSABLE) ×1
ELECT CAUTERY BLADE 6.4 (BLADE) ×2 IMPLANT
ELECT REM PT RETURN 9FT ADLT (ELECTROSURGICAL) ×2
ELECTRODE REM PT RTRN 9FT ADLT (ELECTROSURGICAL) ×1 IMPLANT
GAUZE 4X4 16PLY ~~LOC~~+RFID DBL (SPONGE) ×2 IMPLANT
GLOVE SURG SYN 6.5 ES PF (GLOVE) ×4 IMPLANT
GLOVE SURG UNDER POLY LF SZ7 (GLOVE) ×4 IMPLANT
GOWN STRL REUS W/ TWL LRG LVL3 (GOWN DISPOSABLE) ×3 IMPLANT
GOWN STRL REUS W/TWL LRG LVL3 (GOWN DISPOSABLE) ×6
GRASPER SUT TROCAR 14GX15 (MISCELLANEOUS) IMPLANT
IRRIGATOR SUCT 8 DISP DVNC XI (IRRIGATION / IRRIGATOR) IMPLANT
IRRIGATOR SUCTION 8MM XI DISP (IRRIGATION / IRRIGATOR)
IV NS 1000ML (IV SOLUTION)
IV NS 1000ML BAXH (IV SOLUTION) IMPLANT
LABEL OR SOLS (LABEL) ×2 IMPLANT
MANIFOLD NEPTUNE II (INSTRUMENTS) ×2 IMPLANT
NEEDLE HYPO 22GX1.5 SAFETY (NEEDLE) ×2 IMPLANT
NEEDLE INSUFFLATION 14GA 120MM (NEEDLE) ×2 IMPLANT
NS IRRIG 500ML POUR BTL (IV SOLUTION) ×2 IMPLANT
OBTURATOR OPTICAL STANDARD 8MM (TROCAR) ×1
OBTURATOR OPTICAL STND 8 DVNC (TROCAR) ×1
OBTURATOR OPTICALSTD 8 DVNC (TROCAR) ×1 IMPLANT
PACK LAP CHOLECYSTECTOMY (MISCELLANEOUS) ×2 IMPLANT
PENCIL ELECTRO HAND CTR (MISCELLANEOUS) ×2 IMPLANT
SEAL CANN UNIV 5-8 DVNC XI (MISCELLANEOUS) ×3 IMPLANT
SEAL XI 5MM-8MM UNIVERSAL (MISCELLANEOUS) ×3
SET TUBE SMOKE EVAC HIGH FLOW (TUBING) ×2 IMPLANT
SOLUTION ELECTROLUBE (MISCELLANEOUS) ×2 IMPLANT
STAPLER CANNULA SEAL DVNC XI (STAPLE) ×1 IMPLANT
STAPLER CANNULA SEAL XI (STAPLE) ×1
SUT MNCRL 4-0 (SUTURE) ×4
SUT MNCRL 4-0 27XMFL (SUTURE) ×2
SUT VICRYL 0 AB UR-6 (SUTURE) ×2 IMPLANT
SUTURE MNCRL 4-0 27XMF (SUTURE) ×2 IMPLANT
SYR 30ML LL (SYRINGE) IMPLANT

## 2020-09-24 NOTE — Transfer of Care (Signed)
Immediate Anesthesia Transfer of Care Note  Patient: Paige Serrano  Procedure(s) Performed: XI ROBOTIC ASSISTED LAPAROSCOPIC CHOLECYSTECTOMY (Abdomen)  Patient Location: PACU  Anesthesia Type:General  Level of Consciousness: drowsy  Airway & Oxygen Therapy: Patient Spontanous Breathing and Patient connected to face mask oxygen  Post-op Assessment: Report given to RN  Post vital signs: stable  Last Vitals:  Vitals Value Taken Time  BP    Temp    Pulse 66 09/24/20 1538  Resp 17 09/24/20 1538  SpO2 100 % 09/24/20 1538  Vitals shown include unvalidated device data.  Last Pain:  Vitals:   09/24/20 1347  TempSrc: Tympanic  PainSc: 6       Patients Stated Pain Goal: 1 (09/24/20 1347)  Complications: No notable events documented.

## 2020-09-24 NOTE — Anesthesia Procedure Notes (Signed)
Procedure Name: Intubation Date/Time: 09/24/2020 2:17 PM Performed by: Jaye Beagle, CRNA Pre-anesthesia Checklist: Patient identified, Emergency Drugs available, Suction available and Patient being monitored Patient Re-evaluated:Patient Re-evaluated prior to induction Oxygen Delivery Method: Circle system utilized Preoxygenation: Pre-oxygenation with 100% oxygen Induction Type: IV induction Ventilation: Mask ventilation without difficulty Laryngoscope Size: McGraph and 3 Grade View: Grade II Tube type: Oral Tube size: 6.5 mm Number of attempts: 1 Airway Equipment and Method: Stylet and Oral airway Placement Confirmation: ETT inserted through vocal cords under direct vision, positive ETCO2 and breath sounds checked- equal and bilateral Secured at: 21 cm Tube secured with: Tape Dental Injury: Teeth and Oropharynx as per pre-operative assessment

## 2020-09-24 NOTE — Anesthesia Preprocedure Evaluation (Signed)
Anesthesia Evaluation  Patient identified by MRN, date of birth, ID band Patient awake    Reviewed: Allergy & Precautions, H&P , NPO status , Patient's Chart, lab work & pertinent test results  Airway Mallampati: II  TM Distance: >3 FB Neck ROM: full    Dental  (+) Teeth Intact   Pulmonary neg pulmonary ROS,    Pulmonary exam normal        Cardiovascular hypertension, Pt. on medications Normal cardiovascular exam     Neuro/Psych negative neurological ROS  negative psych ROS   GI/Hepatic negative GI ROS, Neg liver ROS, GERD  Medicated,  Endo/Other  negative endocrine ROSMorbid obesity  Renal/GU      Musculoskeletal   Abdominal   Peds  Hematology negative hematology ROS (+)   Anesthesia Other Findings   Reproductive/Obstetrics negative OB ROS                            Anesthesia Physical  Anesthesia Plan  ASA: 2  Anesthesia Plan: General   Post-op Pain Management:    Induction: Intravenous  PONV Risk Score and Plan: 3 and Ondansetron, Dexamethasone and Scopolamine patch - Pre-op  Airway Management Planned: Oral ETT  Additional Equipment:   Intra-op Plan:   Post-operative Plan: Extubation in OR  Informed Consent: I have reviewed the patients History and Physical, chart, labs and discussed the procedure including the risks, benefits and alternatives for the proposed anesthesia with the patient or authorized representative who has indicated his/her understanding and acceptance.     Dental Advisory Given  Plan Discussed with: Anesthesiologist, CRNA and Surgeon  Anesthesia Plan Comments:        Anesthesia Quick Evaluation

## 2020-09-24 NOTE — H&P (View-Only) (Signed)
Subjective:   CC: biliary colic  HPI:  Paige Serrano is a 20 y.o. female who presents for evaluation of above cc.  Symptoms were first noted 3 weeks ago. Pain is sharp, constant, never resolved, located in RUQ, occasional right flank.  Associated with nausea, exacerbated by movement.  Ultram and toradol does not resolve pain. Rest of ROS negative as noted below, including dysuria, hematuria.  Previous US, and multiple CBC, CMP relatively normal except for minor leukocytosis and slight elevation in bili level.  HIDA showed EF of 31%      Past Medical History:  has a past medical history of GERD (gastroesophageal reflux disease) and Ovarian cyst.  Past Surgical History:  has a past surgical history that includes No past surgeries; laparoscopy (N/A, 01/02/2018); Hysteroscopy with D & C (N/A, 01/02/2018); Intrauterine device (iud) insertion (N/A, 01/02/2018); Chromopertubation (N/A, 01/02/2018); and Laparoscopic unilateral salpingectomy (Right, 01/02/2018).  Family History: reviewed and not relevant to CC  Social History:  reports that she has never smoked. She has never used smokeless tobacco. She reports that she does not drink alcohol and does not use drugs.  Current Medications:  Prior to Admission medications   Medication Sig Start Date End Date Taking? Authorizing Provider  amLODipine (NORVASC) 5 MG tablet Take 1 tablet (5 mg total) by mouth daily. 02/28/20   Irean Hong, MD  HYDROcodone-acetaminophen (NORCO) 5-325 MG tablet Take 1 tablet by mouth every 6 (six) hours as needed for moderate pain. 02/28/20   Irean Hong, MD  ibuprofen (ADVIL) 800 MG tablet Take 1 tablet (800 mg total) by mouth every 8 (eight) hours as needed for moderate pain. 02/28/20   Irean Hong, MD  lisinopril-hydrochlorothiazide (ZESTORETIC) 10-12.5 MG tablet Take 1 tablet by mouth daily. 04/28/19 07/19/21  [provider]  omeprazole (PRILOSEC) 20 MG capsule Take 1 capsule by mouth daily. 09/16/20  09/30/20  [provider]    Allergies:  Allergies as of 09/24/2020   (No Known Allergies)    ROS:  General: Denies weight loss, weight gain, fatigue, fevers, chills, and night sweats. Eyes: Denies blurry vision, double vision, eye pain, itchy eyes, and tearing. Ears: Denies hearing loss, earache, and ringing in ears. Nose: Denies sinus pain, congestion, infections, runny nose, and nosebleeds. Mouth/throat: Denies hoarseness, sore throat, bleeding gums, and difficulty swallowing. Heart: Denies chest pain, palpitations, racing heart, irregular heartbeat, leg pain or swelling, and decreased activity tolerance. Respiratory: Denies breathing difficulty, shortness of breath, wheezing, cough, and sputum. GI: Denies change in appetite, heartburn,  vomiting, constipation, diarrhea, and blood in stool. GU: Denies difficulty urinating, pain with urinating, urgency, frequency, blood in urine. Musculoskeletal: Denies joint stiffness, pain, swelling, muscle weakness. Skin: Denies rash, itching, mass, tumors, sores, and boils Neurologic: Denies headache, fainting, dizziness, seizures, numbness, and tingling. Psychiatric: Denies depression, anxiety, difficulty sleeping, and memory loss. Endocrine: Denies heat or cold intolerance, and increased thirst or urination. Blood/lymph: Denies easy bruising, and swollen glands     Objective:     VSS   Constitutional :  alert, cooperative, appears stated age, and mild distress  Lymphatics/Throat:  no asymmetry, masses, or scars  Respiratory:  clear to auscultation bilaterally  Cardiovascular:  regular rate and rhythm, S1, S2 normal, no murmur, click, rub or gallop  Gastrointestinal: Soft, no guarding, but focal TTP in epgastric, RUQ, and flank .   Musculoskeletal: Steady movement  Skin: Cool and moist  Psychiatric: Normal affect, non-agitated, not confused       LABS:  CMP Latest Ref Rng & Units 09/20/2020 02/28/2020 01/02/2018  Glucose 70  - 99 mg/dL 101(H) 112(H) 95  BUN 6 - 20 mg/dL 7 12 11  Creatinine 0.44 - 1.00 mg/dL 0.71 0.76 0.64  Sodium 135 - 145 mmol/L 139 134(L) 139  Potassium 3.5 - 5.1 mmol/L 3.8 3.4(L) 3.8  Chloride 98 - 111 mmol/L 105 102 107  CO2 22 - 32 mmol/L 26 20(L) 25  Calcium 8.9 - 10.3 mg/dL 9.4 9.0 9.2  Total Protein 6.5 - 8.1 g/dL 7.3 7.3 -  Total Bilirubin 0.3 - 1.2 mg/dL 1.1 2.7(H) -  Alkaline Phos 38 - 126 U/L 49 68 -  AST 15 - 41 U/L 18 20 -  ALT 0 - 44 U/L 27 28 -   CBC Latest Ref Rng & Units 09/20/2020 02/28/2020 01/02/2018  WBC 4.0 - 10.5 K/uL 11.2(H) 12.6(H) 8.6  Hemoglobin 12.0 - 15.0 g/dL 15.4(H) 15.1(H) 14.1  Hematocrit 36.0 - 46.0 % 45.8 46.3(H) 44.6  Platelets 150 - 400 K/uL 435(H) 421(H) 396     RADS: CLINICAL DATA:  Right upper quadrant pain   EXAM: NUCLEAR MEDICINE HEPATOBILIARY IMAGING WITH GALLBLADDER EF   TECHNIQUE: Sequential images of the abdomen were obtained out to 60 minutes following intravenous administration of radiopharmaceutical. After oral ingestion of Ensure, gallbladder ejection fraction was determined. At 60 min, normal ejection fraction is greater than 33%.   RADIOPHARMACEUTICALS:  5.03 mCi Tc-99m  Choletec IV   COMPARISON:  Ultrasound 09/01/2020   FINDINGS: Prompt uptake and biliary excretion of activity by the liver is seen. Gallbladder activity is visualized, consistent with patency of cystic duct. Biliary activity passes into small bowel, consistent with patent common bile duct.   Calculated gallbladder ejection fraction is 31%. (Normal gallbladder ejection fraction with Ensure is greater than 33%.)   IMPRESSION: 1. Negative for acute gallbladder disease. 2. Slightly decreased gallbladder ejection fraction, this can be seen with functional gallbladder disease in the appropriate clinical setting     Electronically Signed   By: Kim  Fujinaga M.D.   On: 09/21/2020 15:26   Assessment:      Biliary colic, possible biliary dyskinesia  based on HIDA, but symptom presentation is very atypical except for location of pain.  Due to persistent and unrelenting pain pt is reporting, will proceed with diagnostic laparoscopy and cholecystectomy.  Plan:      Discussed the risk of surgery including post-op infxn, seroma, biloma, chronic pain, poor-delayed wound healing, retained gallstone, conversion to open procedure, post-op SBO or ileus, and need for additional procedures to address said risks.  The risks of general anesthetic including MI, CVA, sudden death or even reaction to anesthetic medications also discussed. Alternatives include continued observation.  Benefits include possible symptom relief, prevention of complications including acute cholecystitis, pancreatitis.  Typical post operative recovery of 3-5 days rest, continued pain in area and incision sites, possible loose stools up to 4-6 weeks, also discussed.  The patient understands the risks, any and all questions were answered to the patient's satisfaction.  NO GUARANTEE OF ANY SYMPTOM RELIEF AT THIS POINT, PATIENT AND MOTHER BOTH VERBALIZED UNDERSTANDING AND STILL WISHES TO PROCEED AND ACCEPTS ALL RISK INVOLVED.  

## 2020-09-24 NOTE — H&P (Signed)
Subjective:   CC: biliary colic  HPI:  Paige Serrano is a 20 y.o. female who presents for evaluation of above cc.  Symptoms were first noted 3 weeks ago. Pain is sharp, constant, never resolved, located in RUQ, occasional right flank.  Associated with nausea, exacerbated by movement.  Ultram and toradol does not resolve pain. Rest of ROS negative as noted below, including dysuria, hematuria.  Previous US, and multiple CBC, CMP relatively normal except for minor leukocytosis and slight elevation in bili level.  HIDA showed EF of 31%      Past Medical History:  has a past medical history of GERD (gastroesophageal reflux disease) and Ovarian cyst.  Past Surgical History:  has a past surgical history that includes No past surgeries; laparoscopy (N/A, 01/02/2018); Hysteroscopy with D & C (N/A, 01/02/2018); Intrauterine device (iud) insertion (N/A, 01/02/2018); Chromopertubation (N/A, 01/02/2018); and Laparoscopic unilateral salpingectomy (Right, 01/02/2018).  Family History: reviewed and not relevant to CC  Social History:  reports that she has never smoked. She has never used smokeless tobacco. She reports that she does not drink alcohol and does not use drugs.  Current Medications:  Prior to Admission medications   Medication Sig Start Date End Date Taking? Authorizing Provider  amLODipine (NORVASC) 5 MG tablet Take 1 tablet (5 mg total) by mouth daily. 02/28/20   Irean Hong, MD  HYDROcodone-acetaminophen (NORCO) 5-325 MG tablet Take 1 tablet by mouth every 6 (six) hours as needed for moderate pain. 02/28/20   Irean Hong, MD  ibuprofen (ADVIL) 800 MG tablet Take 1 tablet (800 mg total) by mouth every 8 (eight) hours as needed for moderate pain. 02/28/20   Irean Hong, MD  lisinopril-hydrochlorothiazide (ZESTORETIC) 10-12.5 MG tablet Take 1 tablet by mouth daily. 04/28/19 07/19/21  [provider]  omeprazole (PRILOSEC) 20 MG capsule Take 1 capsule by mouth daily. 09/16/20  09/30/20  [provider]    Allergies:  Allergies as of 09/24/2020   (No Known Allergies)    ROS:  General: Denies weight loss, weight gain, fatigue, fevers, chills, and night sweats. Eyes: Denies blurry vision, double vision, eye pain, itchy eyes, and tearing. Ears: Denies hearing loss, earache, and ringing in ears. Nose: Denies sinus pain, congestion, infections, runny nose, and nosebleeds. Mouth/throat: Denies hoarseness, sore throat, bleeding gums, and difficulty swallowing. Heart: Denies chest pain, palpitations, racing heart, irregular heartbeat, leg pain or swelling, and decreased activity tolerance. Respiratory: Denies breathing difficulty, shortness of breath, wheezing, cough, and sputum. GI: Denies change in appetite, heartburn,  vomiting, constipation, diarrhea, and blood in stool. GU: Denies difficulty urinating, pain with urinating, urgency, frequency, blood in urine. Musculoskeletal: Denies joint stiffness, pain, swelling, muscle weakness. Skin: Denies rash, itching, mass, tumors, sores, and boils Neurologic: Denies headache, fainting, dizziness, seizures, numbness, and tingling. Psychiatric: Denies depression, anxiety, difficulty sleeping, and memory loss. Endocrine: Denies heat or cold intolerance, and increased thirst or urination. Blood/lymph: Denies easy bruising, and swollen glands     Objective:     VSS   Constitutional :  alert, cooperative, appears stated age, and mild distress  Lymphatics/Throat:  no asymmetry, masses, or scars  Respiratory:  clear to auscultation bilaterally  Cardiovascular:  regular rate and rhythm, S1, S2 normal, no murmur, click, rub or gallop  Gastrointestinal: Soft, no guarding, but focal TTP in epgastric, RUQ, and flank .   Musculoskeletal: Steady movement  Skin: Cool and moist  Psychiatric: Normal affect, non-agitated, not confused       LABS:  CMP Latest Ref Rng & Units 09/20/2020 02/28/2020 01/02/2018  Glucose 70  - 99 mg/dL 751(W) 258(N) 95  BUN 6 - 20 mg/dL 7 12 11   Creatinine 0.44 - 1.00 mg/dL 2.77 8.24  Sodium 135 - 145 mmol/L 139 134(L) 139  Potassium 3.5 - 5.1 mmol/L 3.8 3.4(L) 3.8  Chloride 98 - 111 mmol/L 105 102 107  CO2 22 - 32 mmol/L 26 20(L) 25  Calcium 8.9 - 10.3 mg/dL 9.4 9.0 9.2  Total Protein 6.5 - 8.1 g/dL 7.3 7.3 -  Total Bilirubin 0.3 - 1.2 mg/dL 1.1 2.35) -  Alkaline Phos 38 - 126 U/L 49 68 -  AST 15 - 41 U/L 18 20 -  ALT 0 - 44 U/L 27 28 -   CBC Latest Ref Rng & Units 09/20/2020 02/28/2020 01/02/2018  WBC 4.0 - 10.5 K/uL 11.2(H) 12.6(H) 8.6  Hemoglobin 12.0 - 15.0 g/dL 15.4(H) 15.1(H) 14.1  Hematocrit 36.0 - 46.0 % 45.8 46.3(H) 44.6  Platelets 150 - 400 K/uL 435(H) 421(H) 396     RADS: CLINICAL DATA:  Right upper quadrant pain   EXAM: NUCLEAR MEDICINE HEPATOBILIARY IMAGING WITH GALLBLADDER EF   TECHNIQUE: Sequential images of the abdomen were obtained out to 60 minutes following intravenous administration of radiopharmaceutical. After oral ingestion of Ensure, gallbladder ejection fraction was determined. At 60 min, normal ejection fraction is greater than 33%.   RADIOPHARMACEUTICALS:  5.03 mCi Tc-63m  Choletec IV   COMPARISON:  Ultrasound 09/01/2020   FINDINGS: Prompt uptake and biliary excretion of activity by the liver is seen. Gallbladder activity is visualized, consistent with patency of cystic duct. Biliary activity passes into small bowel, consistent with patent common bile duct.   Calculated gallbladder ejection fraction is 31%. (Normal gallbladder ejection fraction with Ensure is greater than 33%.)   IMPRESSION: 1. Negative for acute gallbladder disease. 2. Slightly decreased gallbladder ejection fraction, this can be seen with functional gallbladder disease in the appropriate clinical setting     Electronically Signed   By: 09/03/2020 M.D.   On: 09/21/2020 15:26   Assessment:      Biliary colic, possible biliary dyskinesia  based on HIDA, but symptom presentation is very atypical except for location of pain.  Due to persistent and unrelenting pain pt is reporting, will proceed with diagnostic laparoscopy and cholecystectomy.  Plan:      Discussed the risk of surgery including post-op infxn, seroma, biloma, chronic pain, poor-delayed wound healing, retained gallstone, conversion to open procedure, post-op SBO or ileus, and need for additional procedures to address said risks.  The risks of general anesthetic including MI, CVA, sudden death or even reaction to anesthetic medications also discussed. Alternatives include continued observation.  Benefits include possible symptom relief, prevention of complications including acute cholecystitis, pancreatitis.  Typical post operative recovery of 3-5 days rest, continued pain in area and incision sites, possible loose stools up to 4-6 weeks, also discussed.  The patient understands the risks, any and all questions were answered to the patient's satisfaction.  NO GUARANTEE OF ANY SYMPTOM RELIEF AT THIS POINT, PATIENT AND MOTHER BOTH VERBALIZED UNDERSTANDING AND STILL WISHES TO PROCEED AND ACCEPTS ALL RISK INVOLVED.

## 2020-09-24 NOTE — Discharge Instructions (Addendum)
Laparoscopic Cholecystectomy, Care After This sheet gives you information about how to care for yourself after your procedure. Your doctor may also give you more specific instructions. If you have problems or questions, contact your doctor. Follow these instructions at home: Care for cuts from surgery (incisions)  Follow instructions from your doctor about how to take care of your cuts from surgery. Make sure you: Wash your hands with soap and water before you change your bandage (dressing). If you cannot use soap and water, use hand sanitizer. Change your bandage as told by your doctor. Leave stitches (sutures), skin glue, or skin tape (adhesive) strips in place. They may need to stay in place for 2 weeks or longer. If tape strips get loose and curl up, you may trim the loose edges. Do not remove tape strips completely unless your doctor says it is okay. Do not take baths, swim, or use a hot tub until your doctor says it is okay. OK TO SHOWER 24HRS AFTER YOUR SURGERY.  Check your surgical cut area every day for signs of infection. Check for: More redness, swelling, or pain. More fluid or blood. Warmth. Pus or a bad smell. Activity Do not drive or use heavy machinery while taking prescription pain medicine. Do not play contact sports until your doctor says it is okay. Do not drive for 24 hours if you were given a medicine to help you relax (sedative). Rest as needed. Do not return to work or school until your doctor says it is okay. General instructions  tylenol and advil as needed for discomfort.  Please alternate between the two every four hours as needed for pain.    Use narcotics, if prescribed, only when tylenol and motrin is not enough to control pain.  325-650mg every 8hrs to max of 3000mg/24hrs (including the 325mg in every norco dose) for the tylenol.    Advil up to 800mg per dose every 8hrs as needed for pain.   To prevent or treat constipation while you are taking prescription  pain medicine, your doctor may recommend that you: Drink enough fluid to keep your pee (urine) clear or pale yellow. Take over-the-counter or prescription medicines. Eat foods that are high in fiber, such as fresh fruits and vegetables, whole grains, and beans. Limit foods that are high in fat and processed sugars, such as fried and sweet foods. Contact a doctor if: You develop a rash. You have more redness, swelling, or pain around your surgical cuts. You have more fluid or blood coming from your surgical cuts. Your surgical cuts feel warm to the touch. You have pus or a bad smell coming from your surgical cuts. You have a fever. One or more of your surgical cuts breaks open. Get help right away if: You have trouble breathing. You have chest pain. You have pain that is getting worse in your shoulders. You faint or feel dizzy when you stand. You have very bad pain in your belly (abdomen). You are sick to your stomach (nauseous) for more than one day. You have throwing up (vomiting) that lasts for more than one day. You have leg pain. This information is not intended to replace advice given to you by your health care provider. Make sure you discuss any questions you have with your health care provider. Document Released: 11/30/2007 Document Revised: 09/11/2015 Document Reviewed: 08/09/2015 Elsevier Interactive Patient Education  2019 [employer]  AMBULATORY SURGERY  DISCHARGE INSTRUCTIONS   The drugs that you were given will stay in your   system until tomorrow so for the next 24 hours you should not:  Drive an automobile Make any legal decisions Drink any alcoholic beverage   You may resume regular meals tomorrow.  Today it is better to start with liquids and gradually work up to solid foods.  You may eat anything you prefer, but it is better to start with liquids, then soup and crackers, and gradually work up to solid foods.   Please notify your doctor immediately if you  have any unusual bleeding, trouble breathing, redness and pain at the surgery site, drainage, fever, or pain not relieved by medication.    Additional Instructions:   Please contact your physician with any problems or Same Day Surgery at [phone], Monday through Friday 6 am to 4 pm, or [facility] Main number at [phone].  

## 2020-09-24 NOTE — Interval H&P Note (Signed)
History and Physical Interval Note:  09/24/2020 12:57 PM  Paige Serrano  has presented today for surgery, with the diagnosis of K81.0 acute cholecystitis.  The various methods of treatment have been discussed with the patient and family. After consideration of risks, benefits and other options for treatment, the patient has consented to  Procedure(s): XI ROBOTIC ASSISTED LAPAROSCOPIC CHOLECYSTECTOMY (N/A) as a surgical intervention.  The patient's history has been reviewed, patient examined, no change in status, stable for surgery.  I have reviewed the patient's chart and labs.  Questions were answered to the patient's satisfaction.     Zaven Klemens Tonna Boehringer

## 2020-09-24 NOTE — Op Note (Signed)
Preoperative diagnosis:  biliary colic  Postoperative diagnosis: chronic cholecystitis  Procedure: Robotic assisted Laparoscopic Cholecystectomy.   Anesthesia: GETA   Surgeon: Sung Amabile  Specimen: Gallbladder  Complications: None  EBL: 47mL  Wound Classification: Clean Contaminated  Indications: see HPI  Findings: Critical view of safety noted Cystic duct and artery identified, ligated and divided, clips remained intact at end of procedure Adequate hemostasis  Description of procedure:  The patient was placed on the operating table in the supine position. SCDs placed, pre-op abx administered.  General anesthesia was induced and OG tube placed by anesthesia. A time-out was completed verifying correct patient, procedure, site, positioning, and implant(s) and/or special equipment prior to beginning this procedure. The abdomen was prepped and draped in the usual sterile fashion.    Veress needle was placed at the Palmer's point and insufflation was started after confirming a positive saline drop test and no immediate increase in abdominal pressure.  After reaching 15 mm, the Veress needle was removed and a 8 mm port was placed via optiview technique under umbilicus measured 60mm from gallbladder.  The abdomen was inspected and no abnormalities or injuries were found.  Under direct vision, ports were placed in the following locations: One 12 mm patient left of the umbilicus, 8cm from the optiviewed port, one 8 mm port placed to the patient right of the umbilical port 8 cm apart.  1 additional 8 mm port placed lateral to the 81mm port.  Once ports were placed, The table was placed in the reverse Trendelenburg position with the right side up. The Xi platform was brought into the operative field and docked to the ports successfully.  An endoscope was placed through the umbilical port, fenestrated grasper through the adjacent patient right port, prograsp to the far patient left port, and then a  hook cautery in the left port.  The dome of the gallbladder was grasped with prograsp, passed and retracted over the dome of the liver. Minimal adhesions between the gallbladder and omentum, duodenum and transverse colon were lysed via hook cautery. The infundibulum was grasped with the fenestrated grasper and retracted toward the right lower quadrant. This maneuver exposed Calot's triangle. The peritoneum overlying the gallbladder infundibulum was then dissected and the cystic duct and cystic artery identified.  Critical view of safety with the liver bed clearly visible behind the duct and artery with no additional structures noted.  The cystic duct and cystic artery clipped and divided close to the gallbladder.     The gallbladder was then dissected from its peritoneal and liver bed attachments by electrocautery. Hemostasis was checked prior to removing the hook cautery and the Endo Catch bag was then placed through the 12 mm port and the gallbladder was removed.  The gallbladder was passed off the table as a specimen. There was no evidence of bleeding from the gallbladder fossa or cystic artery or leakage of the bile from the cystic duct stump. The 12 mm port site closed with PMI using 0 vicryl under direct vision.  Abdomen desufflated and secondary trocars were removed under direct vision. No bleeding was noted. All skin incisions then closed with subcuticular sutures of 4-0 monocryl and dressed with topical skin adhesive. The orogastric tube was removed and patient extubated.  The patient tolerated the procedure well and was taken to the postanesthesia care unit in stable condition.  All sponge and instrument count correct at end of procedure.

## 2020-09-25 NOTE — Anesthesia Postprocedure Evaluation (Signed)
Anesthesia Post Note  Patient: STARLYNN KLINKNER  Procedure(s) Performed: XI ROBOTIC ASSISTED LAPAROSCOPIC CHOLECYSTECTOMY (Abdomen)  Patient location during evaluation: PACU Anesthesia Type: General Level of consciousness: awake and alert Pain management: pain level controlled Vital Signs Assessment: post-procedure vital signs reviewed and stable Respiratory status: spontaneous breathing, nonlabored ventilation, respiratory function stable and patient connected to nasal cannula oxygen Cardiovascular status: blood pressure returned to baseline and stable Postop Assessment: no apparent nausea or vomiting Anesthetic complications: no   No notable events documented.   Last Vitals:  Vitals:   09/24/20 1600 09/24/20 1630  BP: 93/66 103/80  Pulse: 63   Resp: 18   Temp:  37.1 C  SpO2: 95% 97%    Last Pain:  Vitals:   09/24/20 1630  TempSrc:   PainSc: 5                  Felicita Gage

## 2020-09-28 LAB — SURGICAL PATHOLOGY

## 2020-12-22 ENCOUNTER — Emergency Department: Payer: Managed Care, Other (non HMO)

## 2020-12-22 ENCOUNTER — Emergency Department
Admission: EM | Admit: 2020-12-22 | Discharge: 2020-12-22 | Disposition: A | Discharge status: Home / Self Care | Payer: Managed Care, Other (non HMO) | Attending: Emergency Medicine | Admitting: Emergency Medicine

## 2020-12-22 DIAGNOSIS — H53149 Visual discomfort, unspecified: Secondary | ICD-10-CM | POA: Diagnosis not present

## 2020-12-22 DIAGNOSIS — R0789 Other chest pain: Secondary | ICD-10-CM | POA: Diagnosis not present

## 2020-12-22 DIAGNOSIS — Z79899 Other long term (current) drug therapy: Secondary | ICD-10-CM | POA: Diagnosis not present

## 2020-12-22 DIAGNOSIS — R519 Headache, unspecified: Secondary | ICD-10-CM | POA: Diagnosis present

## 2020-12-22 DIAGNOSIS — U071 COVID-19: Secondary | ICD-10-CM | POA: Diagnosis not present

## 2020-12-22 DIAGNOSIS — R202 Paresthesia of skin: Secondary | ICD-10-CM | POA: Diagnosis not present

## 2020-12-22 DIAGNOSIS — I10 Essential (primary) hypertension: Secondary | ICD-10-CM | POA: Diagnosis not present

## 2020-12-22 DIAGNOSIS — R200 Anesthesia of skin: Secondary | ICD-10-CM

## 2020-12-22 LAB — CBC
HCT: 53.7 % — ABNORMAL HIGH (ref 36.0–46.0)
Hemoglobin: 19 g/dL — ABNORMAL HIGH (ref 12.0–15.0)
MCH: 30.6 pg (ref 26.0–34.0)
MCHC: 35.4 g/dL (ref 30.0–36.0)
MCV: 86.5 fL (ref 80.0–100.0)
Platelets: 728 10*3/uL — ABNORMAL HIGH (ref 150–400)
RBC: 6.21 MIL/uL — ABNORMAL HIGH (ref 3.87–5.11)
RDW: 13.8 % (ref 11.5–15.5)
WBC: 18.5 10*3/uL — ABNORMAL HIGH (ref 4.0–10.5)
nRBC: 0 % (ref 0.0–0.2)

## 2020-12-22 LAB — BASIC METABOLIC PANEL
Anion gap: 13 (ref 5–15)
BUN: 23 mg/dL — ABNORMAL HIGH (ref 6–20)
CO2: 21 mmol/L — ABNORMAL LOW (ref 22–32)
Calcium: 9.8 mg/dL (ref 8.9–10.3)
Chloride: 100 mmol/L (ref 98–111)
Creatinine, Ser: 0.85 mg/dL (ref 0.44–1.00)
GFR, Estimated: >60 mL/min (ref >60)
Glucose, Bld: 94 mg/dL (ref 70–99)
Potassium: 3.2 mmol/L — ABNORMAL LOW (ref 3.5–5.1)
Sodium: 134 mmol/L — ABNORMAL LOW (ref 135–145)

## 2020-12-22 LAB — TROPONIN I (HIGH SENSITIVITY)
Troponin I (High Sensitivity): 3 ng/L (ref <18)
Troponin I (High Sensitivity): 3 ng/L (ref <18)

## 2020-12-22 MED ORDER — ONDANSETRON HCL 4 MG/2ML IJ SOLN
4.0000 mg | Freq: Once | INTRAMUSCULAR | Status: AC
  Administered 2020-12-22: 4 mg via INTRAVENOUS

## 2020-12-22 MED ORDER — FENTANYL CITRATE PF 50 MCG/ML IJ SOSY: 50.0000 ug | PREFILLED_SYRINGE | INTRAMUSCULAR | Status: DC | PRN

## 2020-12-22 MED ORDER — GADOBUTROL 1 MMOL/ML IV SOLN
10.0000 mL | Freq: Once | INTRAVENOUS | Status: AC | PRN
  Administered 2020-12-22: 10 mL via INTRAVENOUS

## 2020-12-22 MED ORDER — METOCLOPRAMIDE HCL 5 MG/ML IJ SOLN
10.0000 mg | Freq: Once | INTRAMUSCULAR | Status: AC
  Administered 2020-12-22: 10 mg via INTRAVENOUS
  Filled 2020-12-22: qty 2

## 2020-12-22 MED ORDER — KETOROLAC TROMETHAMINE 30 MG/ML IJ SOLN
30.0000 mg | Freq: Once | INTRAMUSCULAR | Status: AC
  Administered 2020-12-22: 30 mg via INTRAVENOUS
  Filled 2020-12-22: qty 1

## 2020-12-22 MED ORDER — IOHEXOL 350 MG/ML SOLN
75.0000 mL | Freq: Once | INTRAVENOUS | Status: AC | PRN
  Administered 2020-12-22: 75 mL via INTRAVENOUS

## 2020-12-22 MED ORDER — DIPHENHYDRAMINE HCL 50 MG/ML IJ SOLN
50.0000 mg | Freq: Once | INTRAMUSCULAR | Status: AC
  Administered 2020-12-22: 50 mg via INTRAVENOUS
  Filled 2020-12-22: qty 1

## 2020-12-22 MED ORDER — ONDANSETRON HCL 4 MG/2ML IJ SOLN
INTRAMUSCULAR | Status: AC
  Filled 2020-12-22: qty 2

## 2020-12-22 MED ORDER — SODIUM CHLORIDE 0.9 % IV BOLUS
1000.0000 mL | Freq: Once | INTRAVENOUS | Status: AC
  Administered 2020-12-22: 1000 mL via INTRAVENOUS

## 2020-12-22 NOTE — Plan of Care (Signed)
Discussed patient briefly with Dr. Vicente Males as a curbside.  On his clarification of her last known well, she had symptom onset at 830 or 9 AM, putting her out of any thrombolytic treatment window.  She has had recent COVID-19 infection being treated with packed fluids and steroids and had numbness that started in her left face and gradually progressed over 20 minutes to involve her left upper and lower extremities, which has been ongoing since then and has been associated with a headache that is migrainous in nature.  Her neurological exam is otherwise unremarkable other than numbness of the left side.    CT head was negative for acute intracranial process and CTA chest was negative for PE.    Vitals are notable for tachycardia to the 110s, respirations in the low 20s, blood pressures 110s/80s.    Labs are notable for mild hyponatremia to 134, mild hypokalemia to 3.2, mildly increased BUN at 23, creatinine 0.85 with a GFR greater than 60, leukocytosis to 18.5, hemoconcentration with hemoglobin of 19 (baseline 15.4), thrombocytosis to 728 (baseline 435).  Discussed that given her COVID diagnosis and her obesity (BMI 43.74) she is at risk for stroke despite her young age.  Complex migraine is another possible diagnosis.  Multiple sclerosis could also present with new onset sensory symptoms at this age.  Recommended MRI brain and cervical spine with and without contrast, as well as migraine cocktails.  If these imaging studies are negative and her symptoms resolved fully with headache resolution she may have outpatient follow-up.  Neurology will be available if additional questions arise or if imaging is positive  Past Medical History:  Diagnosis Date   GERD (gastroesophageal reflux disease)    H/O   Headache    migraines   Hypertension    Ovarian cyst    Past Surgical History:  Procedure Laterality Date   CHROMOPERTUBATION N/A 01/02/2018   Procedure: CHROMOPERTUBATION;  Surgeon: Ward, Elenora Fender,  MD;  Location: [facility];  Service: Gynecology;  Laterality: N/A;   HYSTEROSCOPY WITH D & C N/A 01/02/2018   Procedure: DILATATION AND CURETTAGE /HYSTEROSCOPY;  Surgeon: Ward, Elenora Fender, MD;  Location: [facility];  Service: Gynecology;  Laterality: N/A;   INTRAUTERINE DEVICE (IUD) INSERTION N/A 01/02/2018   Procedure: INTRAUTERINE DEVICE (IUD) INSERTION;  Surgeon: Ward, Elenora Fender, MD;  Location: [facility];  Service: Gynecology;  Laterality: N/A;   LAPAROSCOPIC UNILATERAL SALPINGECTOMY Right 01/02/2018   Procedure: LAPAROSCOPIC UNILATERAL SALPINGECTOMY;  Surgeon: Ward, Elenora Fender, MD;  Location: [facility];  Service: Gynecology;  Laterality: Right;   LAPAROSCOPY N/A 01/02/2018   Procedure: LAPAROSCOPY DIAGNOSTIC;  Surgeon: Ward, Elenora Fender, MD;  Location: [facility];  Service: Gynecology;  Laterality: N/A;   NO PAST SURGERIES       Current Outpatient Medications  Medication Instructions   HYDROcodone-acetaminophen (NORCO) 5-325 MG tablet 1 tablet, Oral, Every 6 hours PRN   ibuprofen (ADVIL) 800 mg, Oral, Every 8 hours PRN   lisinopril-hydrochlorothiazide (ZESTORETIC) 10-12.5 MG tablet 1 tablet, Oral, Daily   omeprazole (PRILOSEC) 20 MG capsule 1 capsule, Oral, Daily  Taking steroids as well per Dr. Hansel Feinstein MD-PhD [facility] [phone]  [facility] coverage for [facility] is from 8 AM to 4 AM in-house and 4 PM to 8 PM by telephone/video. 8 PM to 8 AM emergent questions or overnight urgent questions should be addressed to Teleneurology On-call or [facility] neurohospitalist; contact information can be found on AMION

## 2020-12-22 NOTE — ED Notes (Signed)
Patient transported to CT 

## 2020-12-22 NOTE — ED Provider Notes (Signed)
Emergency Medicine Provider Triage Evaluation Note  Paige Serrano , a 20 y.o. female  was evaluated in triage.  Pt complains of chest pain and dizziness with associated headache.  Patient 1 episode of emesis prior to arrival.  She reports onset of numbness to the left arm and the left side of her face about 1130 this morning.  She is otherwise getting complete sentences.  She does endorse COVID 2 weeks ago, for which she is still taking a COVID-related medication.  She initially presented to her PCP for ongoing chest pain and shortness of breath for the last few days.  Review of Systems  Positive: CP, SOB, LUE numbness Negative: FCS  Physical Exam  BP 110/86   Pulse (!) 117   Temp 98 F (36.7 C) (Axillary)   Resp (!) 22   Ht 5\' 3"  (1.6 m)   Wt 112 kg   SpO2 97%   BMI 43.74 kg/m  Gen:   Awake, no distress  lethargic Resp:  Normal effort CTA MSK:   Moves extremities without difficulty  Other:  CVS: Tachy rate  Medical Decision Making  Medically screening exam initiated at 12:44 PM.  Appropriate orders placed.  was informed that the remainder of the evaluation will be completed by another provider, this initial triage assessment does not replace that evaluation, and the importance of remaining in the ED until their evaluation is complete.  Patient with ED evaluation of several days of chest pain, shortness of breath, and new onset of left extremity numbness today.   [name], PA-C 12/22/20 1246    12/24/20, MD 12/22/20 1349

## 2020-12-22 NOTE — ED Provider Notes (Signed)
[facility] Emergency Department Provider Note   ____________________________________________   Event Date/Time   First MD Initiated Contact with Patient 12/22/20 1356     (approximate)  I have reviewed the triage vital signs and the nursing notes.   HISTORY  Chief Complaint Chest Pain and Numbness    HPI Paige Serrano is a 20 y.o. female who presents for left-sided numbness that began at approximately 930 this morning  LOCATION: Left face, arm, and leg DURATION: Began at 930 this morning in clinic TIMING: Stable since onset SEVERITY: Moderate QUALITY: Numbness CONTEXT: Patient states that she has had COVID for the last 2 weeks and has been on treatment for it and while at clinic today she began having onset over 20 minutes of left face and then arm and then leg numbness MODIFYING FACTORS: Denies any exacerbating or relieving factors ASSOCIATED SYMPTOMS: Chest pain, headache, photophobia   Per medical record review, patient has history of migraines          Past Medical History:  Diagnosis Date   GERD (gastroesophageal reflux disease)    H/O   Headache    migraines   Hypertension    Ovarian cyst     Patient Active Problem List   Diagnosis Date Noted   Endometrial mass 01/02/2018   Pelvic pain 01/02/2018    Past Surgical History:  Procedure Laterality Date   CHROMOPERTUBATION N/A 01/02/2018   Procedure: CHROMOPERTUBATION;  Surgeon: Ward, Elenora Fender, MD;  Location: [facility];  Service: Gynecology;  Laterality: N/A;   HYSTEROSCOPY WITH D & C N/A 01/02/2018   Procedure: DILATATION AND CURETTAGE /HYSTEROSCOPY;  Surgeon: Ward, Elenora Fender, MD;  Location: [facility];  Service: Gynecology;  Laterality: N/A;   INTRAUTERINE DEVICE (IUD) INSERTION N/A 01/02/2018   Procedure: INTRAUTERINE DEVICE (IUD) INSERTION;  Surgeon: Ward, Elenora Fender, MD;  Location: [facility];  Service: Gynecology;  Laterality: N/A;   LAPAROSCOPIC UNILATERAL SALPINGECTOMY  Right 01/02/2018   Procedure: LAPAROSCOPIC UNILATERAL SALPINGECTOMY;  Surgeon: Ward, Elenora Fender, MD;  Location: [facility];  Service: Gynecology;  Laterality: Right;   LAPAROSCOPY N/A 01/02/2018   Procedure: LAPAROSCOPY DIAGNOSTIC;  Surgeon: Ward, Elenora Fender, MD;  Location: [facility];  Service: Gynecology;  Laterality: N/A;   NO PAST SURGERIES      Prior to Admission medications   Medication Sig Start Date End Date Taking? Authorizing Provider  HYDROcodone-acetaminophen (NORCO) 5-325 MG tablet Take 1 tablet by mouth every 6 (six) hours as needed for up to 6 doses for moderate pain. 09/24/20   Tonna Boehringer, Isami, DO  ibuprofen (ADVIL) 800 MG tablet Take 1 tablet (800 mg total) by mouth every 8 (eight) hours as needed for moderate pain. 02/28/20   Irean Hong, MD  lisinopril-hydrochlorothiazide (ZESTORETIC) 10-12.5 MG tablet Take 1 tablet by mouth daily. 04/28/19 07/19/21  [provider]  omeprazole (PRILOSEC) 20 MG capsule Take 1 capsule by mouth daily. 09/16/20 09/30/20  [provider]    Allergies Patient has no allergy information on record.  No family history on file.  Social History Social History   Tobacco Use   Smoking status: Never   Smokeless tobacco: Never  Vaping Use   Vaping Use: Never used  Substance Use Topics   Alcohol use: Never    Comment: rare   Drug use: Never    Review of Systems Constitutional: No fever/chills Eyes: No visual changes. ENT: No sore throat. Cardiovascular: Endorses chest pain. Respiratory: Denies shortness of breath. Gastrointestinal: No abdominal  pain.  No nausea, no vomiting.  No diarrhea. Genitourinary: Negative for dysuria. Musculoskeletal: Negative for acute arthralgias Skin: Negative for rash. Neurological: Positive for headaches and left-sided numbness, denies weakness/paresthesias in any extremity Psychiatric: Negative for suicidal ideation/homicidal ideation   ____________________________________________   PHYSICAL  EXAM:  VITAL SIGNS: ED Triage Vitals [12/22/20 1239]  Enc Vitals Group     BP 110/86     Pulse Rate (!) 117     Resp (!) 22     Temp 98 F (36.7 C)     Temp Source Axillary     SpO2 97 %     Weight 246 lb 14.6 oz (112 kg)     Height 5\' 3"  (1.6 m)     Head Circumference      Peak Flow      Pain Score 6     Pain Loc      Pain Edu?      Excl. in GC?    Constitutional: Alert and oriented. Well appearing and in no acute distress. Eyes: Conjunctivae are normal. PERRL. Head: Atraumatic. Nose: No congestion/rhinnorhea. Mouth/Throat: Mucous membranes are moist. Neck: No stridor Cardiovascular: Grossly normal heart sounds.  Good peripheral circulation. Respiratory: Normal respiratory effort.  No retractions. Gastrointestinal: Soft and nontender. No distention. Musculoskeletal: No obvious deformities Neurologic:  Normal speech and language.  Decree sensation over the left face, left upper extremity, and left lower extremity Skin:  Skin is warm and dry. No rash noted. Psychiatric: Mood and affect are normal. Speech and behavior are normal.  ____________________________________________   LABS (all labs ordered are listed, but only abnormal results are displayed)  Labs Reviewed  BASIC METABOLIC PANEL - Abnormal; Notable for the following components:      Result Value   Sodium 134 (*)    Potassium 3.2 (*)    CO2 21 (*)    BUN 23 (*)    All other components within normal limits  CBC - Abnormal; Notable for the following components:   WBC 18.5 (*)    RBC 6.21 (*)    Hemoglobin 19.0 (*)    HCT 53.7 (*)    Platelets 728 (*)    All other components within normal limits  POC URINE PREG, ED  TROPONIN I (HIGH SENSITIVITY)  TROPONIN I (HIGH SENSITIVITY)   ____________________________________________  EKG  ED ECG REPORT I, , the attending physician, personally viewed and interpreted this ECG.  Date: 12/22/2020 EKG Time: 1249 Rate: 127 Rhythm: normal sinus  rhythm QRS Axis: normal Intervals: normal ST/T Wave abnormalities: normal Narrative Interpretation: no evidence of acute ischemia  ____________________________________________  RADIOLOGY  ED MD interpretation: 2 view chest x-ray shows no evidence of acute abnormalities including no pneumonia, pneumothorax, or widened mediastinum  Official radiology report(s): DG Chest 2 View  Result Date: 12/22/2020 CLINICAL DATA:  Chest pain and shortness of breath EXAM: CHEST - 2 VIEW COMPARISON:  02/28/2020 FINDINGS: The heart size and mediastinal contours are within normal limits. Both lungs are clear. The visualized skeletal structures are unremarkable. IMPRESSION: No active cardiopulmonary disease. Electronically Signed   By: 03/01/2020 M.D.   On: 12/22/2020 13:54   CT HEAD WO CONTRAST (12/24/2020)  Result Date: 12/22/2020 CLINICAL DATA:  Dizziness, non-specific EXAM: CT HEAD WITHOUT CONTRAST TECHNIQUE: Contiguous axial images were obtained from the base of the skull through the vertex without intravenous contrast. COMPARISON:  CT head 11/22/2018. FINDINGS: Brain: No evidence of acute infarction, hemorrhage, hydrocephalus, extra-axial collection or mass lesion/mass effect.  Vascular: No hyperdense vessel identified. Skull: No acute fracture. Sinuses/Orbits: Clear sinuses.  Unremarkable orbits. Other: No mastoid effusions. IMPRESSION: No evidence of acute intracranial abnormality. Electronically Signed   By: Feliberto Harts M.D.   On: 12/22/2020 15:18   CT Angio Chest PE W and/or Wo Contrast  Result Date: 12/22/2020 CLINICAL DATA:  PE suspected, chest pain dizziness EXAM: CT ANGIOGRAPHY CHEST WITH CONTRAST TECHNIQUE: Multidetector CT imaging of the chest was performed using the standard protocol during bolus administration of intravenous contrast. Multiplanar CT image reconstructions and MIPs were obtained to evaluate the vascular anatomy. CONTRAST:  5mL OMNIPAQUE IOHEXOL 350 MG/ML SOLN COMPARISON:   02/28/2020 FINDINGS: C Cardiovascular: Satisfactory opacification of the pulmonary arteries to the segmental level. No evidence of pulmonary embolism. Normal heart size. No pericardial effusion. Mediastinum/Nodes: No enlarged mediastinal, hilar, or axillary lymph nodes. Thyroid gland, trachea, and esophagus demonstrate no significant findings. Lungs/Pleura: Lungs are clear. No pleural effusion or pneumothorax. Upper Abdomen: No acute abnormality. Musculoskeletal: No chest wall abnormality. No acute or significant osseous findings. Review of the MIP images confirms the above findings. IMPRESSION: Negative examination for pulmonary embolism. Electronically Signed   By: Jearld Lesch M.D.   On: 12/22/2020 15:17    ____________________________________________   PROCEDURES  Procedure(s) performed (including Critical Care):  Procedures   ____________________________________________   INITIAL IMPRESSION / ASSESSMENT AND PLAN / ED COURSE  As part of my medical decision making, I reviewed the following data within the electronic medical record, if available:  Nursing notes reviewed and incorporated, Labs reviewed, EKG interpreted, Old chart reviewed, Radiograph reviewed and Notes from prior ED visits reviewed and incorporated        Patient is a 20 year old female with above-stated past medical history presents for left-sided numbness that is remained stable since onset. Differential diagnosis includes but is not limited to CVA, TIA, complex migraine, MS Laboratory evaluation is significant for leukocytosis to 18  I spoke to Dr. Iver Nestle allergy who recommends MRI of the head and neck and treatment for possible complex migraine  Care of this patient will be signed out to the oncoming physician at the end of my shift.  All pertinent patient information conveyed and all questions answered.  All further care and disposition decisions will be made by the oncoming physician.      ____________________________________________   FINAL CLINICAL IMPRESSION(S) / ED DIAGNOSES  Final diagnoses:  Numbness  Atypical chest pain  COVID-19 virus infection     ED Discharge Orders     None        Note:  This document was prepared using Dragon voice recognition software and may include unintentional dictation errors.    Merwyn Katos, MD 12/22/20 1538

## 2020-12-22 NOTE — ED Notes (Signed)
Resumed care from [place] rn  pt alert  meds given.  Family with pt.

## 2020-12-22 NOTE — Discharge Instructions (Addendum)
Please seek medical attention for any high fevers, chest pain, shortness of breath, change in behavior, persistent vomiting, bloody stool or any other new or concerning symptoms.  

## 2020-12-22 NOTE — ED Notes (Signed)
Pt in mri 

## 2020-12-22 NOTE — ED Triage Notes (Signed)
Pt to ED for chest pain and shob that started a few days ago. Reports dizziness that started this morning with worsening, headache, emesis x1 day. Numbness to left arm, left leg and left side of face that started at 1130. Speaking in clear sentences.  COVID 2 weeks ago   [place] PA in triage to assess

## 2021-07-14 ENCOUNTER — Ambulatory Visit
Admission: RE | Admit: 2021-07-14 | Discharge: 2021-07-14 | Disposition: A | Discharge status: Home / Self Care | Payer: Managed Care, Other (non HMO) | Source: Ambulatory Visit | Attending: Family Medicine | Admitting: Family Medicine

## 2021-07-14 ENCOUNTER — Other Ambulatory Visit: Payer: Self-pay | Admitting: Family Medicine

## 2021-07-14 DIAGNOSIS — R10823 Right lower quadrant rebound abdominal tenderness: Secondary | ICD-10-CM | POA: Insufficient documentation

## 2021-07-14 DIAGNOSIS — A09 Infectious gastroenteritis and colitis, unspecified: Secondary | ICD-10-CM | POA: Diagnosis not present

## 2021-07-14 MED ORDER — IOHEXOL 300 MG/ML  SOLN
100.0000 mL | Freq: Once | INTRAMUSCULAR | Status: AC | PRN
  Administered 2021-07-14: 100 mL via INTRAVENOUS

## 2021-08-29 ENCOUNTER — Ambulatory Visit: Payer: Managed Care, Other (non HMO)

## 2021-08-29 ENCOUNTER — Other Ambulatory Visit: Payer: Self-pay | Admitting: Family Medicine

## 2021-08-29 ENCOUNTER — Ambulatory Visit
Admission: RE | Admit: 2021-08-29 | Discharge: 2021-08-29 | Disposition: A | Discharge status: Home / Self Care | Payer: Managed Care, Other (non HMO) | Source: Ambulatory Visit | Attending: Family Medicine | Admitting: Family Medicine

## 2021-08-29 DIAGNOSIS — R1032 Left lower quadrant pain: Secondary | ICD-10-CM | POA: Insufficient documentation

## 2021-11-10 IMAGING — CT CT ANGIO CHEST
2 of 6 series · 18 of 46 positions shown · IV contrast (APPLIED)
Comparison: None.

CLINICAL DATA: Chest pain and shortness of breath.

EXAM:
CT ANGIOGRAPHY CHEST WITH CONTRAST
TECHNIQUE: Multidetector CT imaging of the chest was performed using the
standard protocol during bolus administration of intravenous
contrast. Multiplanar CT image reconstructions and MIPs were
obtained to evaluate the vascular anatomy.
CONTRAST:  75mL OMNIPAQUE IOHEXOL 350 MG/ML SOLN

[Series 5: thins · axial · 0.70mm/px · z∈[-582,-384]mm · 15 of 218 slices shown]
[im 10/218  lung]
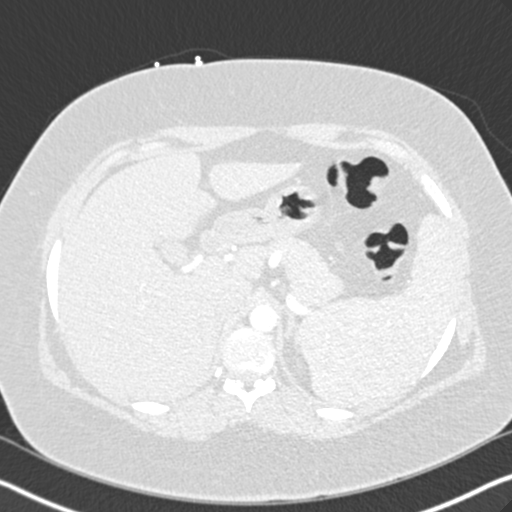
[im 29/218  soft-tissue]
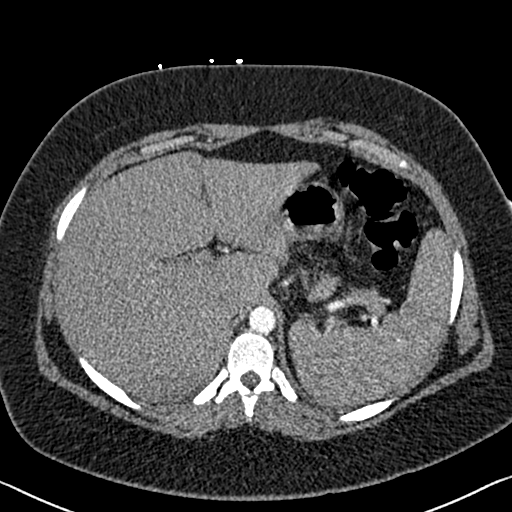
[im 38/218  lung]
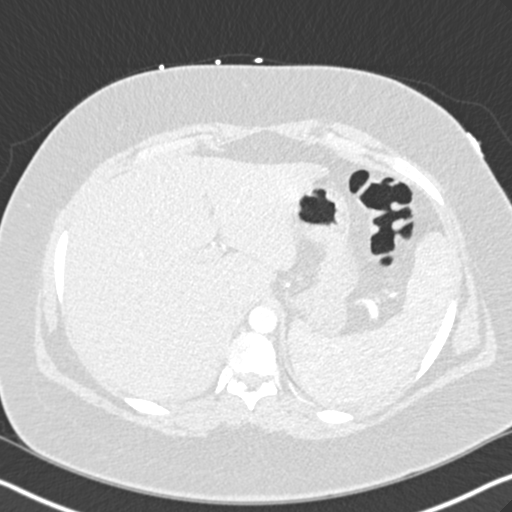
[im 57/218  soft-tissue]
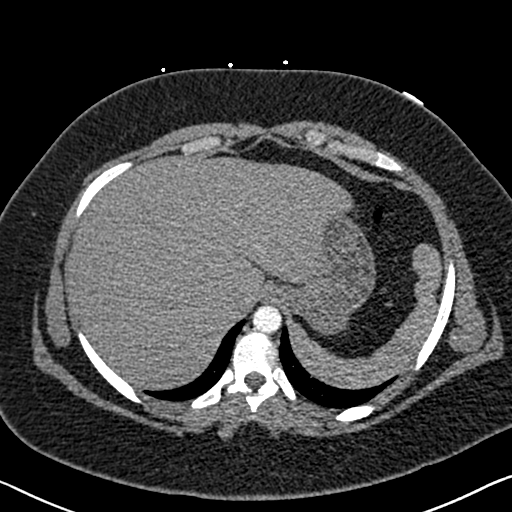
[im 67/218  lung]
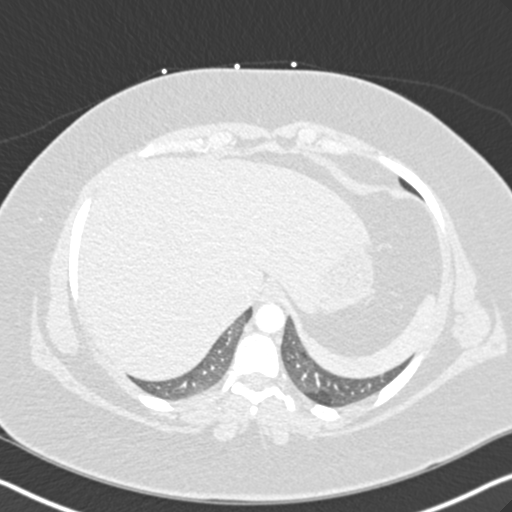
[im 85/218  soft-tissue]
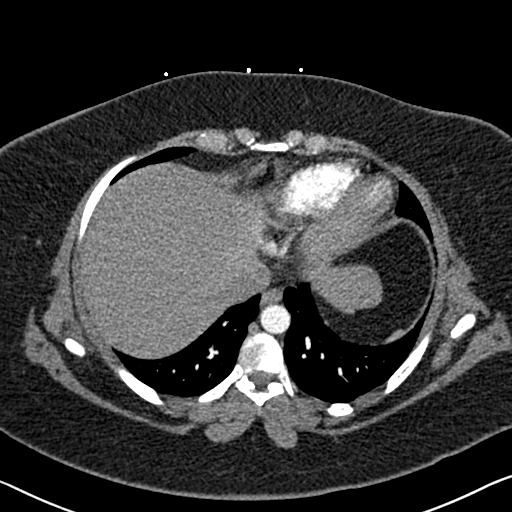
[im 95/218  lung]
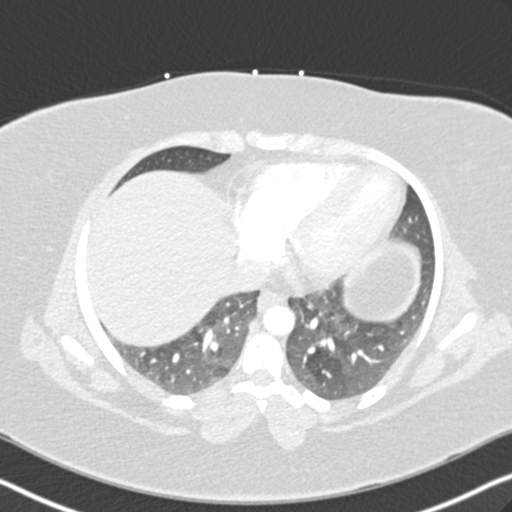
[im 114/218  soft-tissue]
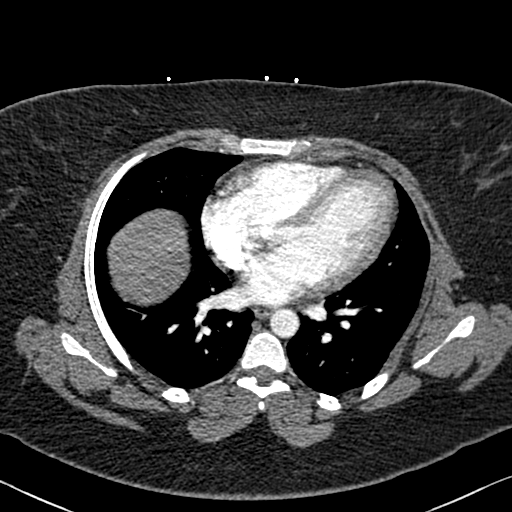
[im 123/218  lung]
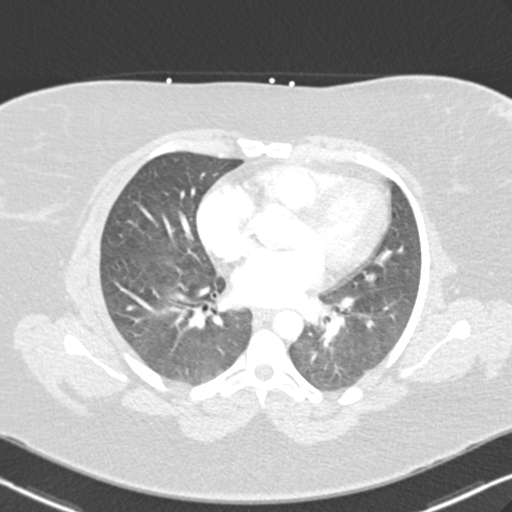
[im 133/218  soft-tissue]
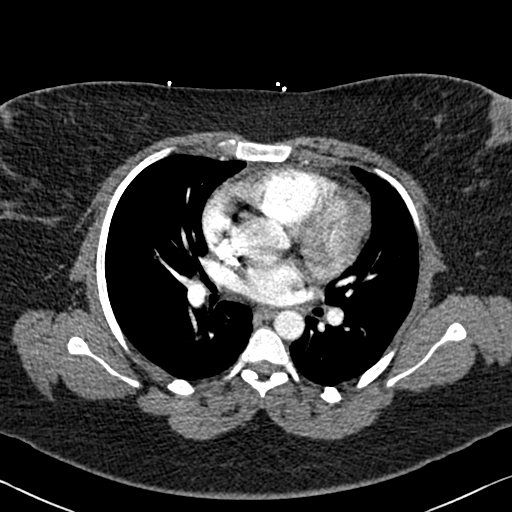
[im 151/218  lung]
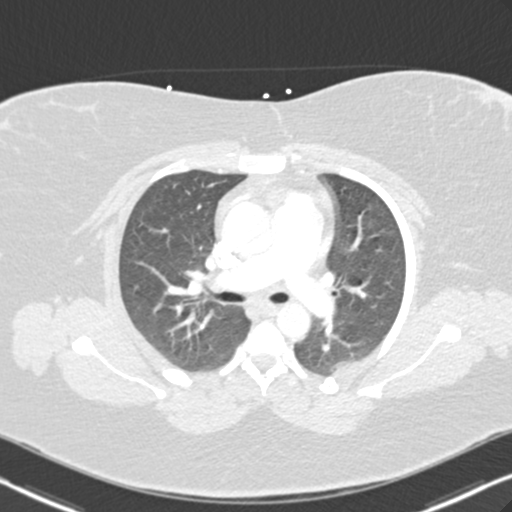
[im 161/218  soft-tissue]
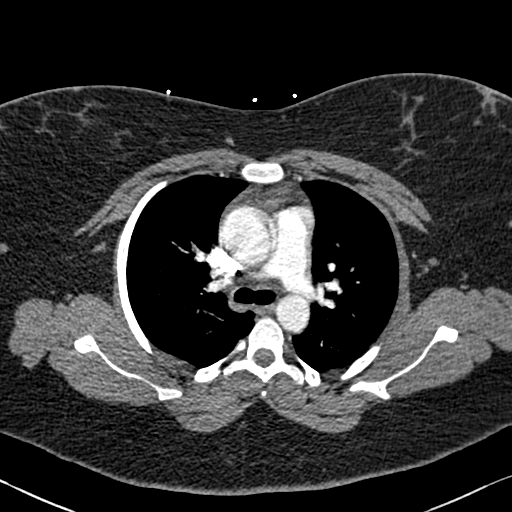
[im 180/218  lung]
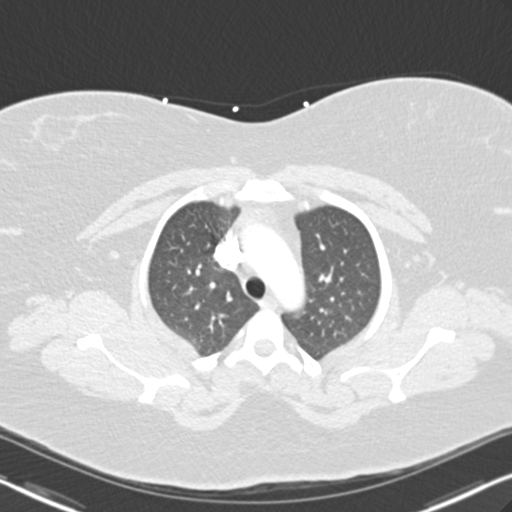
[im 189/218  soft-tissue]
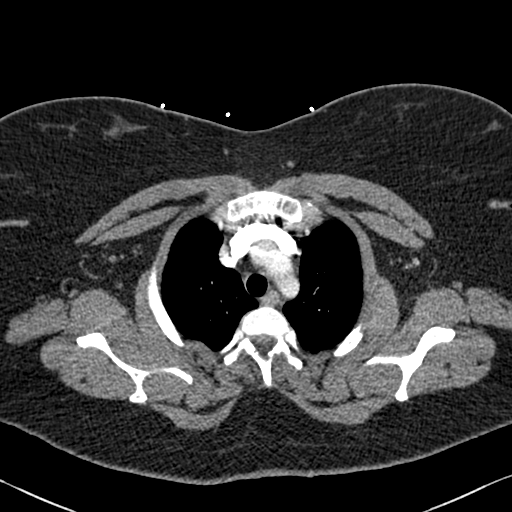
[im 208/218  lung]
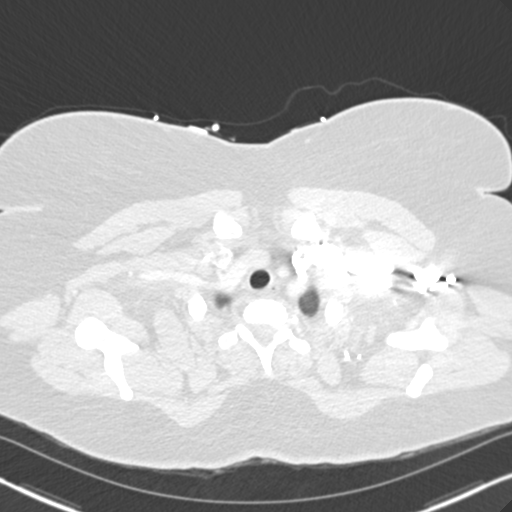

[Series 7: coronal mpr · coronal · 0.44mm/px · 3 of 94 slices shown]
[im 24/94  soft-tissue]
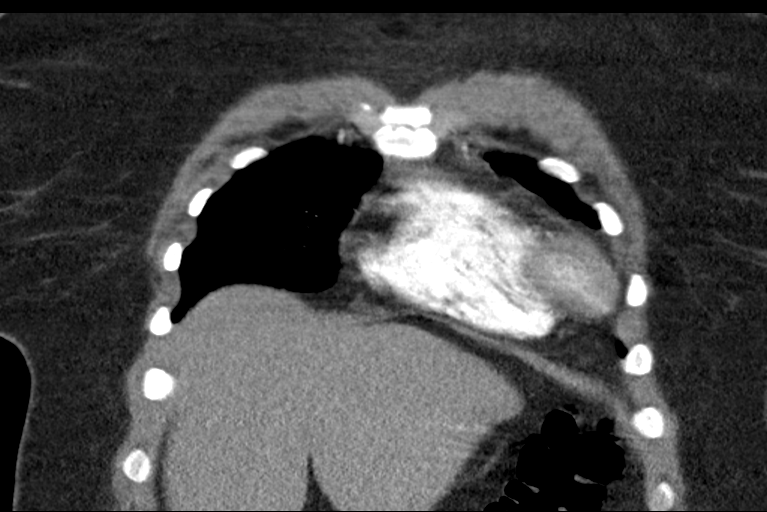
[im 47/94  soft-tissue]
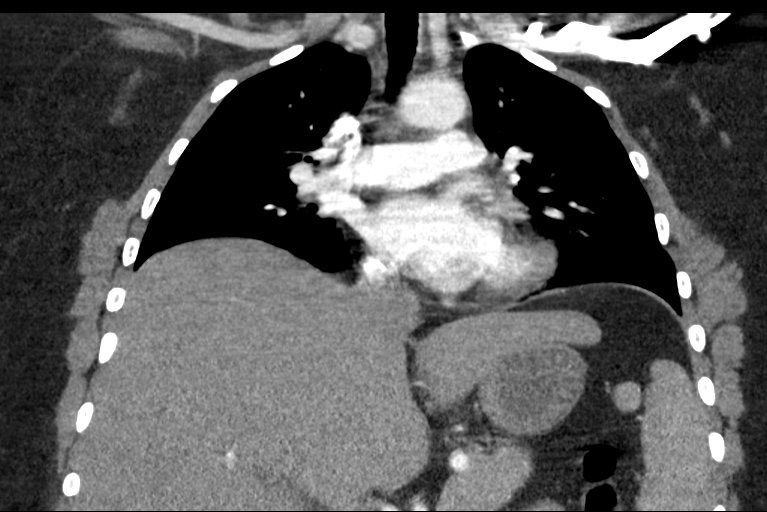
[im 70/94  soft-tissue]
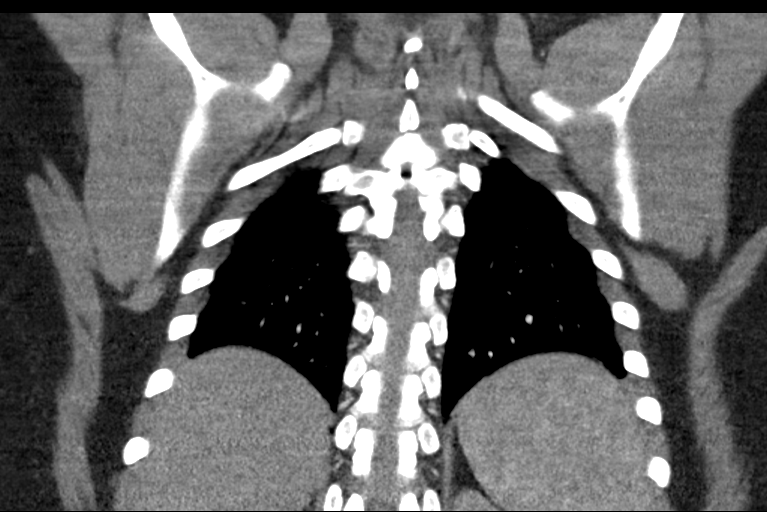

[18 of 46 positions shown; findings below may reference images not displayed]

FINDINGS: Cardiovascular: The heart size is normal. No substantial pericardial
effusion. No thoracic aortic aneurysm. Motion degraded study shows
no filling defect in the pulmonary arteries to suggest the presence
of an acute pulmonary embolus.

Mediastinum/Nodes: No mediastinal lymphadenopathy. There is no hilar
lymphadenopathy. The esophagus has normal imaging features. There is
no axillary lymphadenopathy.

Lungs/Pleura: Mosaic attenuation in the dependent lungs is
nonspecific but likely reflects air trapping. No focal airspace
consolidation. No suspicious nodule or mass. No pleural effusion.

Upper Abdomen: Unremarkable.

Musculoskeletal: No worrisome lytic or sclerotic osseous
abnormality.

Review of the MIP images confirms the above findings.
IMPRESSION: 1. No CT evidence for acute pulmonary embolus.
2. No other acute findings in the chest.

## 2022-02-21 ENCOUNTER — Emergency Department: Payer: Managed Care, Other (non HMO)

## 2022-02-21 ENCOUNTER — Other Ambulatory Visit: Payer: Self-pay

## 2022-02-21 ENCOUNTER — Encounter: Payer: Self-pay | Admitting: Emergency Medicine

## 2022-02-21 ENCOUNTER — Emergency Department
Admission: EM | Admit: 2022-02-21 | Discharge: 2022-02-21 | Disposition: A | Discharge status: Home / Self Care | Payer: Managed Care, Other (non HMO) | Attending: Emergency Medicine | Admitting: Emergency Medicine

## 2022-02-21 DIAGNOSIS — R102 Pelvic and perineal pain: Secondary | ICD-10-CM | POA: Insufficient documentation

## 2022-02-21 LAB — CBC WITH DIFFERENTIAL/PLATELET
Abs Immature Granulocytes: 0.02 10*3/uL (ref 0.00–0.07)
Basophils Absolute: 0.1 10*3/uL (ref 0.0–0.1)
Basophils Relative: 1 %
Eosinophils Absolute: 0.1 10*3/uL (ref 0.0–0.5)
Eosinophils Relative: 1 %
HCT: 46 % (ref 36.0–46.0)
Hemoglobin: 14.9 g/dL (ref 12.0–15.0)
Immature Granulocytes: 0 %
Lymphocytes Relative: 29 %
Lymphs Abs: 2.6 10*3/uL (ref 0.7–4.0)
MCH: 28.4 pg (ref 26.0–34.0)
MCHC: 32.4 g/dL (ref 30.0–36.0)
MCV: 87.6 fL (ref 80.0–100.0)
Monocytes Absolute: 0.5 10*3/uL (ref 0.1–1.0)
Monocytes Relative: 5 %
Neutro Abs: 5.6 10*3/uL (ref 1.7–7.7)
Neutrophils Relative %: 64 %
Platelets: 415 10*3/uL — ABNORMAL HIGH (ref 150–400)
RBC: 5.25 MIL/uL — ABNORMAL HIGH (ref 3.87–5.11)
RDW: 13.6 % (ref 11.5–15.5)
WBC: 8.8 10*3/uL (ref 4.0–10.5)
nRBC: 0 % (ref 0.0–0.2)

## 2022-02-21 LAB — URINALYSIS, ROUTINE W REFLEX MICROSCOPIC
Bilirubin Urine: NEGATIVE
Glucose, UA: NEGATIVE mg/dL
Ketones, ur: NEGATIVE mg/dL
Leukocytes,Ua: NEGATIVE
Nitrite: NEGATIVE
Protein, ur: 30 mg/dL — AB
RBC / HPF: >50 RBC/hpf — ABNORMAL HIGH (ref 0–5)
Specific Gravity, Urine: 1.021 (ref 1.005–1.030)
pH: 5 (ref 5.0–8.0)

## 2022-02-21 LAB — COMPREHENSIVE METABOLIC PANEL
ALT: 26 U/L (ref 0–44)
AST: 24 U/L (ref 15–41)
Albumin: 4.1 g/dL (ref 3.5–5.0)
Alkaline Phosphatase: 51 U/L (ref 38–126)
Anion gap: 8 (ref 5–15)
BUN: 10 mg/dL (ref 6–20)
CO2: 21 mmol/L — ABNORMAL LOW (ref 22–32)
Calcium: 9 mg/dL (ref 8.9–10.3)
Chloride: 111 mmol/L (ref 98–111)
Creatinine, Ser: 0.53 mg/dL (ref 0.44–1.00)
GFR, Estimated: >60 mL/min (ref >60)
Glucose, Bld: 88 mg/dL (ref 70–99)
Potassium: 3.8 mmol/L (ref 3.5–5.1)
Sodium: 140 mmol/L (ref 135–145)
Total Bilirubin: 1 mg/dL (ref 0.3–1.2)
Total Protein: 7 g/dL (ref 6.5–8.1)

## 2022-02-21 LAB — PREGNANCY, URINE: Preg Test, Ur: NEGATIVE

## 2022-02-21 MED ORDER — KETOROLAC TROMETHAMINE 15 MG/ML IJ SOLN
15.0000 mg | Freq: Once | INTRAMUSCULAR | Status: AC
  Administered 2022-02-21: 15 mg via INTRAMUSCULAR
  Filled 2022-02-21: qty 1

## 2022-02-21 MED ORDER — ONDANSETRON 4 MG PO TBDP: 4.0000 mg | ORAL_TABLET | Freq: Three times a day (TID) | ORAL | 0 refills | Status: AC | PRN

## 2022-02-21 NOTE — ED Triage Notes (Signed)
PT states history of ovarian cysts. PT states one of the cysts ruptures Sunday and has been in pain since Thursday before. Pt states unable to get into OB/GYN. Pt states nothing is helping with the pain.

## 2022-02-21 NOTE — ED Notes (Signed)
Attempted to get blood x2. Lab called and stated they will send someone.

## 2022-02-21 NOTE — ED Provider Triage Note (Signed)
  Emergency Medicine Provider Triage Evaluation Note  Paige Serrano , a 21 y.o.female,  was evaluated in triage.  Pt complains of "ovarian pain".  Reports a history of polycystic ovarian syndrome.  She has also had several ovarian cysts.  She has been taken some leftover hydrocodone's acetaminophen, though this is not helping her pain.  She states that she is currently on her period at this time.  Denies any other symptoms.   Review of Systems  Positive: Bilateral pelvic pain Negative: Denies fever, chest pain, vomiting  Physical Exam   Vitals:   02/21/22 0844  BP: (!) 138/119  Pulse: 89  Resp: 16  Temp: 98.5 F (36.9 C)  SpO2: 99%   Gen:   Awake, no distress   Resp:  Normal effort  MSK:   Moves extremities without difficulty  Other:    Medical Decision Making  Given the patient's initial medical screening exam, the following diagnostic evaluation has been ordered. The patient will be placed in the appropriate treatment space, once one is available, to complete the evaluation and treatment. I have discussed the plan of care with the patient and I have advised the patient that an ED physician or mid-level practitioner will reevaluate their condition after the test results have been received, as the results may give them additional insight into the type of treatment they may need.    Diagnostics: Labs, UA, ultrasound  Treatments: none immediately   Varney Daily, [place] 02/21/22 [phone]

## 2022-02-21 NOTE — Discharge Instructions (Signed)
Pain control:  Ibuprofen (motrin/aleve/advil) - You can take 3-4 tablets (600-800 mg) every 6 hours as needed for pain/fever.  Acetaminophen (tylenol) - You can take 2 extra strength tablets (1000 mg) every 6 hours as needed for pain/fever.  You can alternate these medications or take them together.  Make sure you eat food/drink water when taking these medications. 

## 2022-02-21 NOTE — ED Provider Notes (Signed)
[facility] Provider Note    Event Date/Time   First MD Initiated Contact with Patient 02/21/22 1010     (approximate)   History   Cyst   HPI  Paige Serrano is a 21 y.o. female presents to the emergency department with lower abdominal pain.  Endorses 3 days of progressively worsening lower abdominal pain.  Initially was on the left lower abdomen and has now moved and is to bilateral abdomen to the right and lower left.  Nothing improves or worsens.  Took 2 Motrin yesterday without significant improvement.  Tried hydrocodone which she had at home from a prior episode of ovarian cyst rupture and stated that that did not help with the pain.  Similar to prior ovarian cyst.  Actively on her menstrual period.  Endorses nausea and vomiting.  No fever.  No dysuria, urinary urgency or frequency.  No history of kidney stones.  History of endometriosis and ovarian cyst with PCOS.  Denies vaginal discharge or concern for STI.     Physical Exam   Triage Vital Signs: ED Triage Vitals [02/21/22 0844]  Enc Vitals Group     BP (!) 138/119     Pulse Rate 89     Resp 16     Temp 98.5 F (36.9 C)     Temp Source Oral     SpO2 99 %     Weight 245 lb (111.1 kg)     Height 5\' 3"  (1.6 m)     Head Circumference      Peak Flow      Pain Score 7     Pain Loc      Pain Edu?      Excl. in GC?     Most recent vital signs: Vitals:   02/21/22 0844 02/21/22 1249  BP: (!) 138/119 (!) 130/100  Pulse: 89 80  Resp: 16 16  Temp: 98.5 F (36.9 C) 98 F (36.7 C)  SpO2: 99% 99%    Physical Exam Constitutional:      Appearance: She is well-developed.  HENT:     Head: Atraumatic.  Eyes:     Conjunctiva/sclera: Conjunctivae normal.  Cardiovascular:     Rate and Rhythm: Regular rhythm.  Pulmonary:     Effort: No respiratory distress.  Abdominal:     General: There is no distension.     Tenderness: There is abdominal tenderness (Mild tenderness to palpation to  bilateral lower abdomen.  No CVA tenderness.  Negative McBurney's point).  Musculoskeletal:        General: Normal range of motion.     Cervical back: Normal range of motion.  Skin:    General: Skin is warm.  Neurological:     Mental Status: She is alert. Mental status is at baseline.     IMPRESSION / MDM / ASSESSMENT AND PLAN / ED COURSE  I reviewed the triage vital signs and the nursing notes.  Differential diagnosis including ovarian torsion, ovarian cyst, kidney stone, pyelonephritis  Pregnancy test is negative.  Clinical picture is not consistent with PID or cervicitis.    RADIOLOGY I independently reviewed imaging, my interpretation of imaging: Pelvic ultrasound with no signs of ovarian cyst.  No torsion.  No free fluid.  Read as no acute findings.  Bedside renal ultrasound with no signs of hydronephrosis  LABS (all labs ordered are listed, but only abnormal results are displayed) Labs interpreted as -  UA with blood but patient is on her  menstrual cycle.  No signs of urinary tract infection.  Lab work overall reassuring with no signs of of an infectious process.  Labs Reviewed  CBC WITH DIFFERENTIAL/PLATELET - Abnormal; Notable for the following components:      Result Value   RBC 5.25 (*)    Platelets 415 (*)    All other components within normal limits  COMPREHENSIVE METABOLIC PANEL - Abnormal; Notable for the following components:   CO2 21 (*)    All other components within normal limits  URINALYSIS, ROUTINE W REFLEX MICROSCOPIC - Abnormal; Notable for the following components:   Color, Urine YELLOW (*)    APPearance HAZY (*)    Hgb urine dipstick LARGE (*)    Protein, ur 30 (*)    RBC / HPF >50 (*)    Bacteria, UA RARE (*)    All other components within normal limits  PREGNANCY, URINE    TREATMENT   IM ketorolac  Clinical picture concerning for pelvic pain likely secondary to endometriosis.  Discussed NSAIDs for pain control.  Discussed close follow-up  with obstetrician and given return precautions.   PROCEDURES:  Critical Care performed: No  Procedures  Patient's presentation is most consistent with acute presentation with potential threat to life or bodily function.   MEDICATIONS ORDERED IN ED: Medications  ketorolac (TORADOL) 15 MG/ML injection 15 mg (15 mg Intramuscular Given 02/21/22 1238)    FINAL CLINICAL IMPRESSION(S) / ED DIAGNOSES   Final diagnoses:  Pelvic pain     Rx / DC Orders   ED Discharge Orders          Ordered    ondansetron (ZOFRAN-ODT) 4 MG disintegrating tablet  Every 8 hours PRN        02/21/22 1224             Note:  This document was prepared using Dragon voice recognition software and may include unintentional dictation errors.   Corena Herter, MD 02/21/22 1251

## 2022-07-21 ENCOUNTER — Other Ambulatory Visit: Payer: Self-pay | Admitting: Obstetrics and Gynecology

## 2022-07-21 DIAGNOSIS — N644 Mastodynia: Secondary | ICD-10-CM

## 2022-07-26 ENCOUNTER — Ambulatory Visit
Admission: RE | Admit: 2022-07-26 | Discharge: 2022-07-26 | Disposition: A | Discharge status: Home / Self Care | Payer: Managed Care, Other (non HMO) | Source: Ambulatory Visit | Attending: Obstetrics and Gynecology | Admitting: Obstetrics and Gynecology

## 2022-07-26 DIAGNOSIS — N644 Mastodynia: Secondary | ICD-10-CM | POA: Insufficient documentation

## 2023-02-19 ENCOUNTER — Ambulatory Visit
Admission: RE | Admit: 2023-02-19 | Discharge: 2023-02-19 | Disposition: A | Discharge status: Home / Self Care | Payer: Managed Care, Other (non HMO) | Source: Ambulatory Visit | Attending: Family Medicine | Admitting: Family Medicine

## 2023-02-19 ENCOUNTER — Other Ambulatory Visit: Payer: Self-pay | Admitting: Family Medicine

## 2023-02-19 DIAGNOSIS — R519 Headache, unspecified: Secondary | ICD-10-CM | POA: Diagnosis present

## 2023-02-20 ENCOUNTER — Other Ambulatory Visit: Payer: Self-pay | Admitting: Family Medicine

## 2023-02-20 ENCOUNTER — Ambulatory Visit
Admission: RE | Admit: 2023-02-20 | Discharge: 2023-02-20 | Disposition: A | Discharge status: Home / Self Care | Payer: Managed Care, Other (non HMO) | Source: Ambulatory Visit | Attending: Family Medicine | Admitting: Family Medicine

## 2023-02-20 DIAGNOSIS — R519 Headache, unspecified: Secondary | ICD-10-CM | POA: Insufficient documentation

## 2023-02-20 DIAGNOSIS — M542 Cervicalgia: Secondary | ICD-10-CM | POA: Diagnosis present

## 2023-02-21 ENCOUNTER — Emergency Department (HOSPITAL_COMMUNITY)
Admission: EM | Admit: 2023-02-21 | Discharge: 2023-02-22 | Disposition: A | Discharge status: Home / Self Care | Payer: Managed Care, Other (non HMO) | Attending: Emergency Medicine | Admitting: Emergency Medicine

## 2023-02-21 ENCOUNTER — Emergency Department (HOSPITAL_COMMUNITY): Payer: Managed Care, Other (non HMO)

## 2023-02-21 ENCOUNTER — Encounter (HOSPITAL_COMMUNITY): Payer: Self-pay

## 2023-02-21 DIAGNOSIS — W541XXA Struck by dog, initial encounter: Secondary | ICD-10-CM | POA: Diagnosis not present

## 2023-02-21 DIAGNOSIS — R42 Dizziness and giddiness: Secondary | ICD-10-CM | POA: Diagnosis not present

## 2023-02-21 DIAGNOSIS — R519 Headache, unspecified: Secondary | ICD-10-CM | POA: Insufficient documentation

## 2023-02-21 DIAGNOSIS — I10 Essential (primary) hypertension: Secondary | ICD-10-CM | POA: Diagnosis not present

## 2023-02-21 DIAGNOSIS — F0781 Postconcussional syndrome: Secondary | ICD-10-CM | POA: Insufficient documentation

## 2023-02-21 LAB — CBC WITH DIFFERENTIAL/PLATELET
Abs Immature Granulocytes: 0.04 10*3/uL (ref 0.00–0.07)
Basophils Absolute: 0.1 10*3/uL (ref 0.0–0.1)
Basophils Relative: 1 %
Eosinophils Absolute: 0.1 10*3/uL (ref 0.0–0.5)
Eosinophils Relative: 1 %
HCT: 48 % — ABNORMAL HIGH (ref 36.0–46.0)
Hemoglobin: 15.7 g/dL — ABNORMAL HIGH (ref 12.0–15.0)
Immature Granulocytes: 0 %
Lymphocytes Relative: 27 %
Lymphs Abs: 3.8 10*3/uL (ref 0.7–4.0)
MCH: 28.1 pg (ref 26.0–34.0)
MCHC: 32.7 g/dL (ref 30.0–36.0)
MCV: 85.9 fL (ref 80.0–100.0)
Monocytes Absolute: 0.7 10*3/uL (ref 0.1–1.0)
Monocytes Relative: 5 %
Neutro Abs: 9.4 10*3/uL — ABNORMAL HIGH (ref 1.7–7.7)
Neutrophils Relative %: 66 %
Platelets: 491 10*3/uL — ABNORMAL HIGH (ref 150–400)
RBC: 5.59 MIL/uL — ABNORMAL HIGH (ref 3.87–5.11)
RDW: 13.4 % (ref 11.5–15.5)
WBC: 14 10*3/uL — ABNORMAL HIGH (ref 4.0–10.5)
nRBC: 0 % (ref 0.0–0.2)

## 2023-02-21 LAB — BASIC METABOLIC PANEL
Anion gap: 11 (ref 5–15)
BUN: 12 mg/dL (ref 6–20)
CO2: 18 mmol/L — ABNORMAL LOW (ref 22–32)
Calcium: 9.4 mg/dL (ref 8.9–10.3)
Chloride: 107 mmol/L (ref 98–111)
Creatinine, Ser: 0.85 mg/dL (ref 0.44–1.00)
GFR, Estimated: >60 mL/min (ref >60)
Glucose, Bld: 90 mg/dL (ref 70–99)
Potassium: 3.9 mmol/L (ref 3.5–5.1)
Sodium: 136 mmol/L (ref 135–145)

## 2023-02-21 LAB — HCG, SERUM, QUALITATIVE: Preg, Serum: NEGATIVE

## 2023-02-21 NOTE — ED Provider Triage Note (Signed)
Emergency Medicine Provider Triage Evaluation Note  Paige Serrano , a 22 y.o. female  was evaluated in triage.  Pt complains of left arm weakness and new visual disturbances which she characterizes as characters on wrapping paper going upside down.  Left arm weakness started 2 days ago.  Patient has had CT cervical spine and CT head per chart review which were normal.  Diagnosed with a concussion.  Given meclizine with no improvement for her dizziness.  Her vertigo is constant.  Review of Systems  Positive:  Negative: See above   Physical Exam  BP (!) 132/92   Pulse 76   Temp (!) 97.5 F (36.4 C) (Oral)   Resp 14   SpO2 100%  Gen:   Awake, no distress   Resp:  Normal effort  MSK:   Moves extremities without difficulty  Other:  Decreased subjective sensation along the left side of the trigeminal nerve distribution.  Rest of the cranial nerves are intact.  There is 3 out of 5 grip strength 3/5 extension and flexion of the left arm.  Right arm is normal.  Decreased subjective sensation in the left arm and left leg.  No clear dysmetria with finger-to-nose although patient very slow to acclimate.  No obvious nystagmus.  Medical Decision Making  Medically screening exam initiated at 8:16 PM.  Appropriate orders placed.  Paige Serrano was informed that the remainder of the evaluation will be completed by another provider, this initial triage assessment does not replace that evaluation, and the importance of remaining in the ED until their evaluation is complete.     Honor Loh [place], [place] 02/21/23 2018

## 2023-02-21 NOTE — ED Triage Notes (Signed)
Pt is post 1 week from a concussion, in which she was seen by an emergency department who did CT scans for it and Dx with a concussion. She had extreme dizziness post concussion in which she went to her pcp who gave her some dizziness medication for it. She mentions the dizziness is still present, with a headache and some visual abnormalities such as characters appearing upside down on some wrapping paper. She is otherwise stable, alert and able to answer all questions in triage.

## 2023-02-22 MED ORDER — ONDANSETRON HCL 4 MG/2ML IJ SOLN
4.0000 mg | Freq: Once | INTRAMUSCULAR | Status: AC
  Administered 2023-02-22: 4 mg via INTRAVENOUS
  Filled 2023-02-22: qty 2

## 2023-02-22 MED ORDER — KETOROLAC TROMETHAMINE 15 MG/ML IJ SOLN
15.0000 mg | Freq: Once | INTRAMUSCULAR | Status: AC
  Administered 2023-02-22: 15 mg via INTRAVENOUS
  Filled 2023-02-22: qty 1

## 2023-02-22 MED ORDER — HALOPERIDOL LACTATE 5 MG/ML IJ SOLN
2.0000 mg | Freq: Once | INTRAMUSCULAR | Status: AC
  Administered 2023-02-22: 2 mg via INTRAVENOUS
  Filled 2023-02-22: qty 1

## 2023-02-22 MED ORDER — PROCHLORPERAZINE MALEATE 10 MG PO TABS: 10.0000 mg | ORAL_TABLET | Freq: Two times a day (BID) | ORAL | 0 refills | Status: AC | PRN

## 2023-02-22 NOTE — ED Provider Notes (Signed)
[facility] Emergency Department Provider Note MRN:  [id number]  Arrival date & time: 02/22/23     Chief Complaint   Post concussion symptoms    History of Present Illness   Paige Serrano is a 22 y.o. year-old female with a history of migraine presenting to the ED with chief complaint of postconcussion symptoms.  Persistent dizziness described as a room spinning sensation or being on a boat.  Symptoms since being hit very hard in the face or head by her dog 6 days ago.  Had CT imaging at that time that was reassuring.  Having persistent headache and vertigo since then.  Denies numbness or weakness to the arms or legs, no neck or back pain, no chest pain or shortness of breath, no abdominal pain.  Review of Systems  A thorough review of systems was obtained and all systems are negative except as noted in the HPI and PMH.   Patient's Health History    Past Medical History:  Diagnosis Date   GERD (gastroesophageal reflux disease)    H/O   Headache    migraines   Hypertension    Ovarian cyst     Past Surgical History:  Procedure Laterality Date   CHROMOPERTUBATION N/A 01/02/2018   Procedure: CHROMOPERTUBATION;  Surgeon: Ward, Elenora Fender, MD;  Location: [facility] ORS;  Service: Gynecology;  Laterality: N/A;   HYSTEROSCOPY WITH D & C N/A 01/02/2018   Procedure: DILATATION AND CURETTAGE /HYSTEROSCOPY;  Surgeon: Ward, Elenora Fender, MD;  Location: [facility] ORS;  Service: Gynecology;  Laterality: N/A;   INTRAUTERINE DEVICE (IUD) INSERTION N/A 01/02/2018   Procedure: INTRAUTERINE DEVICE (IUD) INSERTION;  Surgeon: Ward, Elenora Fender, MD;  Location: [facility] ORS;  Service: Gynecology;  Laterality: N/A;   LAPAROSCOPIC UNILATERAL SALPINGECTOMY Right 01/02/2018   Procedure: LAPAROSCOPIC UNILATERAL SALPINGECTOMY;  Surgeon: Ward, Elenora Fender, MD;  Location: [facility] ORS;  Service: Gynecology;  Laterality: Right;   LAPAROSCOPY N/A 01/02/2018   Procedure: LAPAROSCOPY DIAGNOSTIC;  Surgeon: Ward,  Elenora Fender, MD;  Location: [facility] ORS;  Service: Gynecology;  Laterality: N/A;   NO PAST SURGERIES      History reviewed. No pertinent family history.  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  Other Topics Concern   Not on file  Social History Narrative   Patient lives with her grandmother,cousin and her children.   3 stairs to get into home.    Feels safe in home.   Social Drivers of Corporate investment banker Strain: Not on file  Food Insecurity: Not on file  Transportation Needs: Not on file  Physical Activity: Not on file  Stress: Not on file  Social Connections: Not on file  Intimate Partner Violence: Not on file     Physical Exam   Vitals:   02/22/23 0345 02/22/23 0500  BP: 134/89 (!) 129/96  Pulse: 71 80  Resp: 18 19  Temp: 98 F (36.7 C)   SpO2: 100% 98%    CONSTITUTIONAL: Well-appearing, NAD NEURO/PSYCH:  Alert and oriented x 3, no focal deficits EYES:  eyes equal and reactive ENT/NECK:  no LAD, no JVD CARDIO: Regular rate, well-perfused, normal S1 and S2 PULM:  CTAB no wheezing or rhonchi GI/GU:  non-distended, non-tender MSK/SPINE:  No gross deformities, no edema SKIN:  no rash, atraumatic   *Additional and/or pertinent findings included in MDM below  Diagnostic and Interventional Summary    EKG Interpretation Date/Time:    Ventricular Rate:    PR Interval:    QRS Duration:    QT Interval:    QTC Calculation:   R Axis:      Text Interpretation:          Labs Reviewed  CBC WITH DIFFERENTIAL/PLATELET - Abnormal; Notable for the following components:      Result Value   WBC 14.0 (*)    RBC 5.59 (*)    Hemoglobin 15.7 (*)    HCT 48.0 (*)    Platelets 491 (*)    Neutro Abs 9.4 (*)    All other components within normal limits  BASIC METABOLIC PANEL - Abnormal; Notable for the following components:   CO2 18 (*)    All other components within normal limits  HCG, SERUM, QUALITATIVE    MR BRAIN WO CONTRAST  Final Result      Medications  ketorolac (TORADOL) 15 MG/ML injection 15 mg (15 mg Intravenous Given 02/22/23 0425)  haloperidol lactate (HALDOL) injection 2 mg (2 mg Intravenous Given 02/22/23 0425)  ondansetron (ZOFRAN) injection 4 mg (4 mg Intravenous Given 02/22/23 0419)     Procedures  /  Critical Care Procedures  ED Course and Medical Decision Making  Initial Impression and Ddx Suspect postconcussive syndrome, other considerations include missed bleeding or injury from the trauma.  Patient endorsing some intermittent decreased strength to the left arm.  Occasional visual disturbance.  Denies neck pain.  MRI to exclude mass or stroke.  Attempting symptomatic management  Past medical/surgical history that increases complexity of ED encounter: Migraines  Interpretation of Diagnostics I personally reviewed the laboratory assessment and my interpretation is as follows: No significant blood count or electrolyte disturbance  MRI brain is normal  Patient Reassessment and Ultimate Disposition/Management     Patient feeling much better and requesting discharge, on reassessment her neurological exam is symmetric and reassuring.  Will refer to neurology.  Patient management required discussion with the following services or consulting groups:  None  Complexity of Problems Addressed Acute illness or injury that poses threat of life of bodily function  Additional Data Reviewed and Analyzed Further history obtained  from: Further history from spouse/family member  Additional Factors Impacting ED Encounter Risk Prescriptions  Elmer Sow. Pilar Plate, MD [facility] Emergency Medicine [facility] [email or web address]  Final Clinical Impressions(s) / ED Diagnoses     ICD-10-CM   1. Post concussive syndrome  F07.81 Ambulatory referral to Neurology      ED Discharge Orders          Ordered    Ambulatory referral to Neurology       Comments: An appointment is requested in approximately: 1 week   02/22/23 0504    prochlorperazine (COMPAZINE) 10 MG tablet  2 times daily PRN        02/22/23 4098             Discharge Instructions Discussed with and Provided to Patient:  Discharge Instructions      You were evaluated in the Emergency Department and after careful evaluation, we did not find any emergent condition requiring admission or further testing in the hospital.  Your exam/testing today is overall reassuring.  Recommend follow-up with neurology to discuss your symptoms.  Continue meclizine at home.  Can use the Compazine as needed for nausea or headache.  Please return to the Emergency Department if you experience any worsening of your condition.   Thank you for allowing [place] to be a part of your care.       Sabas Sous, MD 02/22/23 [phone]

## 2023-02-22 NOTE — Discharge Instructions (Addendum)
You were evaluated in the Emergency Department and after careful evaluation, we did not find any emergent condition requiring admission or further testing in the hospital.  Your exam/testing today is overall reassuring.  Recommend follow-up with neurology to discuss your symptoms.  Continue meclizine at home.  Can use the Compazine as needed for nausea or headache.  Please return to the Emergency Department if you experience any worsening of your condition.   Thank you for allowing [place] to be a part of your care.
